# Patient Record
Sex: Male | Born: 1975
Health system: Southern US, Community
[De-identification: ages and names within clinical notes are randomized; demographics above are authoritative.]

## PROBLEM LIST (undated history)

## (undated) DIAGNOSIS — R3911 Hesitancy of micturition: Secondary | ICD-10-CM

## (undated) DIAGNOSIS — R0600 Dyspnea, unspecified: Secondary | ICD-10-CM

## (undated) DIAGNOSIS — R072 Precordial pain: Secondary | ICD-10-CM

## (undated) DIAGNOSIS — E785 Hyperlipidemia, unspecified: Secondary | ICD-10-CM

## (undated) DIAGNOSIS — R079 Chest pain, unspecified: Secondary | ICD-10-CM

## (undated) DIAGNOSIS — R42 Dizziness and giddiness: Secondary | ICD-10-CM

## (undated) DIAGNOSIS — R002 Palpitations: Secondary | ICD-10-CM

## (undated) DIAGNOSIS — R030 Elevated blood-pressure reading, without diagnosis of hypertension: Secondary | ICD-10-CM

## (undated) DIAGNOSIS — R239 Unspecified skin changes: Secondary | ICD-10-CM

## (undated) DIAGNOSIS — R600 Localized edema: Secondary | ICD-10-CM

## (undated) HISTORY — DX: Palpitations: R00.2

## (undated) HISTORY — DX: Unspecified skin changes: R23.9

## (undated) HISTORY — DX: Localized edema: R60.0

## (undated) HISTORY — DX: Hesitancy of micturition: R39.11

## (undated) HISTORY — DX: Elevated blood-pressure reading, without diagnosis of hypertension: R03.0

## (undated) HISTORY — DX: Chest pain, unspecified: R07.9

## (undated) HISTORY — DX: Dyspnea, unspecified: R06.00

## (undated) HISTORY — DX: Hyperlipidemia, unspecified: E78.5

## (undated) HISTORY — DX: Dizziness and giddiness: R42

## (undated) HISTORY — DX: Precordial pain: R07.2

## (undated) HISTORY — PX: OTHER SURGICAL HISTORY: SHX169

---

## 2003-09-13 ENCOUNTER — Emergency Department (HOSPITAL_COMMUNITY): Admission: EM | Admit: 2003-09-13 | Discharge: 2003-09-13 | Payer: Self-pay | Admitting: Emergency Medicine

## 2005-08-10 IMAGING — CT CT ABDOMEN W/O CM
1 series · 15 of 32 positions shown, 19 images · non-contrast
Comparison: none

CLINICAL DATA: 28 year old with right flank pain.
 CT ABDOMEN AND PELVIS WITHOUT CONTRAST
 Helical CT examination of the abdomen and pelvis was performed without IV contrast.  Dilute oral contrast was not used either and the examination was performed specifically to evaluate for renal/ureteral calculi/obstruction.
 The lung bases are clear.  
 CT ABDOMEN:
 The liver, spleen, pancreas, and adrenal glands demonstrate no significant findings.  The left kidney has a low attenuation lesion in the upper pole/midpole junction region anteriorly.  This is likely a benign renal cyst.  There are small bilateral renal calculi but no evidence for an acute renal obstructive process, hydronephrosis, perinephric interstitial changes, or fluid.  Both ureters are normal in caliber and there is no evidence for upper ureteral calculi.  There are no mesenteric or retroperitoneal masses or adenopathy.  The gallbladder appears normal.  The stomach, duodenum, small bowel, and colon are grossly normal but the study is limited without IV and oral contrast.  The patient does have a small hiatal hernia.
 IMPRESSION
 1.  Small bilateral renal calculi but no evidence for an acute obstructing ureteral calculus.
 2.  Probable cyst associated with the mid to upper pole region of the left kidney anteriorly.
 The appendix is visualized and appears normal.  There are no ureteral calculi and no bladder calculi.  The rectum, sigmoid colon, and visualized small bowel loops appear normal.  No pelvic masses or adenopathy.
 1.  No significant pelvic findings.

[Series 2: renal stone · axial · 0.78mm/px · z∈[-532,-152]mm · 15 of 81 slices shown, 19 images]
[im 6/81  soft-tissue]
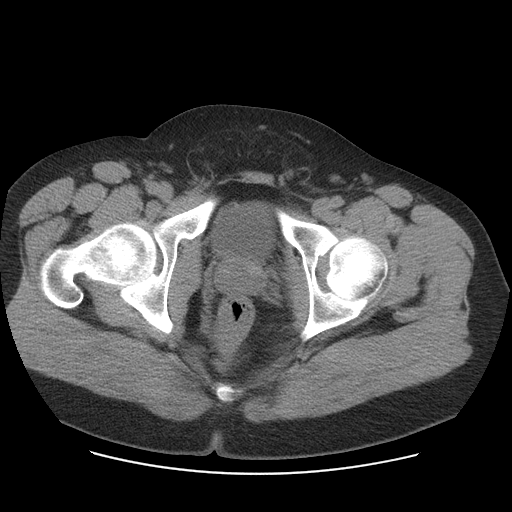
[im 6/81  bone]
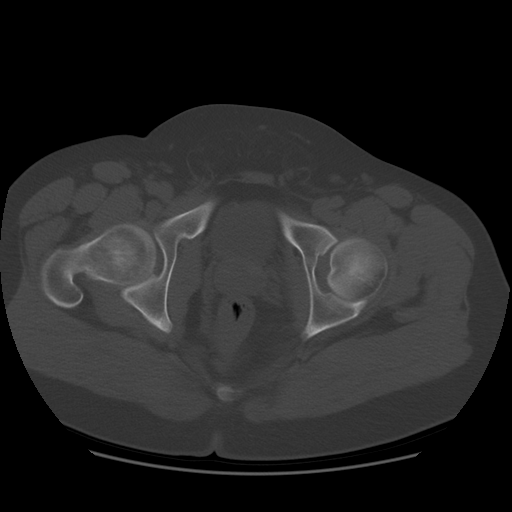
[im 11/81  soft-tissue]
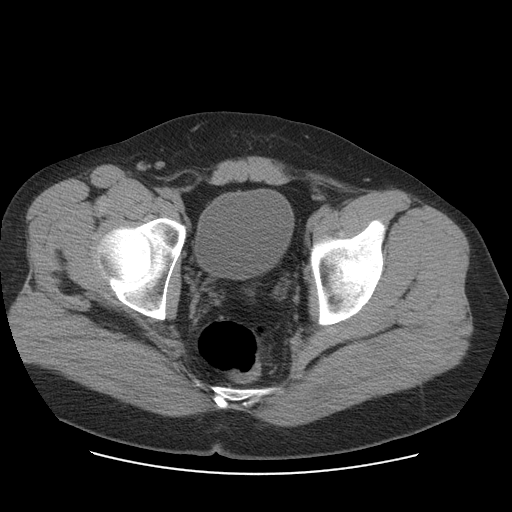
[im 16/81  soft-tissue]
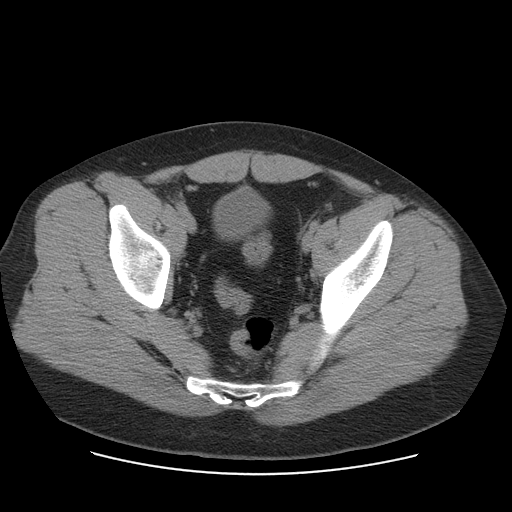
[im 24/81  soft-tissue]
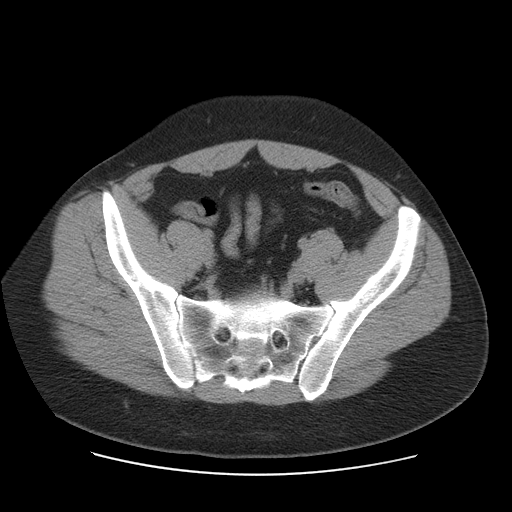
[im 29/81  soft-tissue]
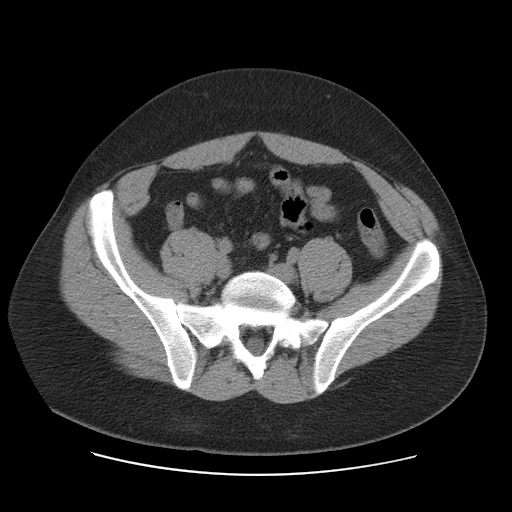
[im 34/81  soft-tissue]
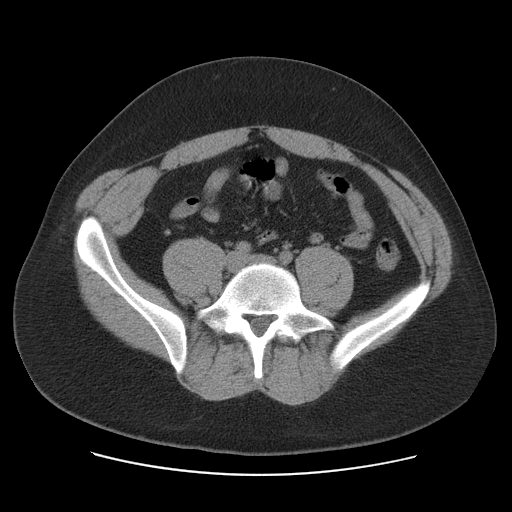
[im 42/81  soft-tissue]
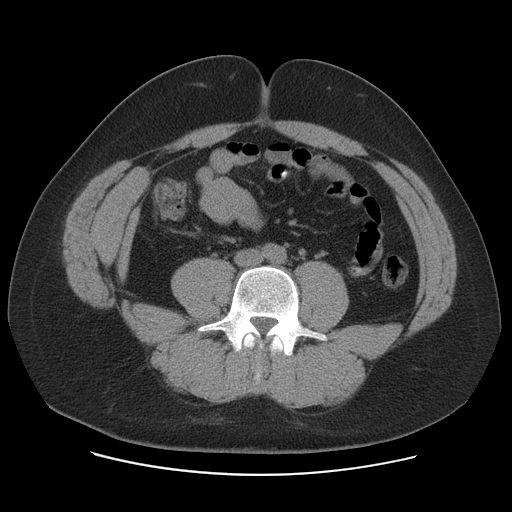
[im 47/81  soft-tissue]
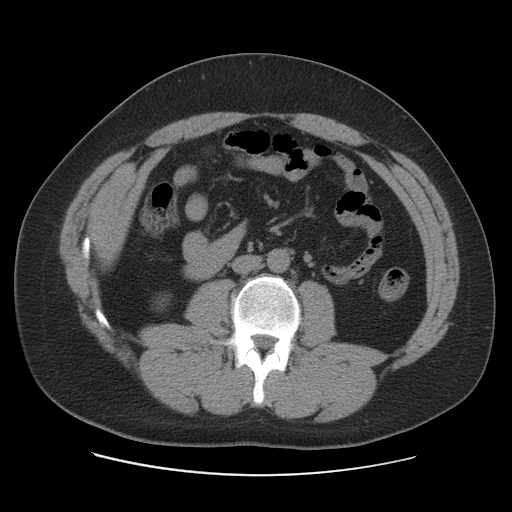
[im 52/81  soft-tissue]
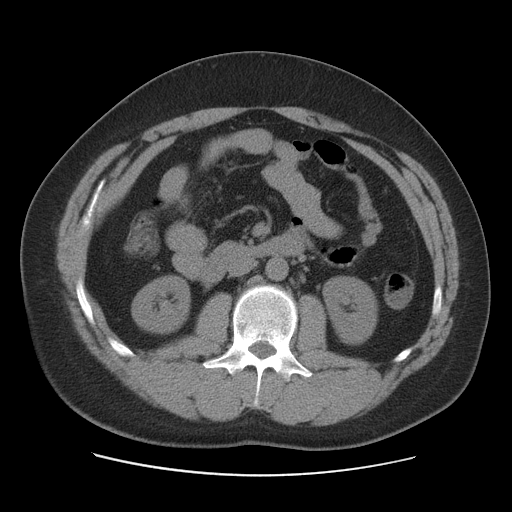
[im 52/81  bone]
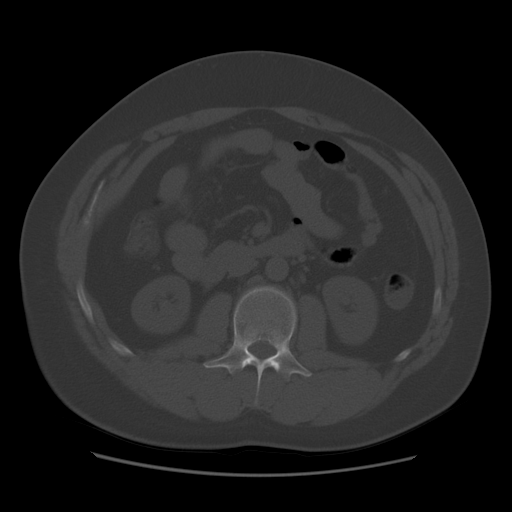
[im 57/81  soft-tissue]
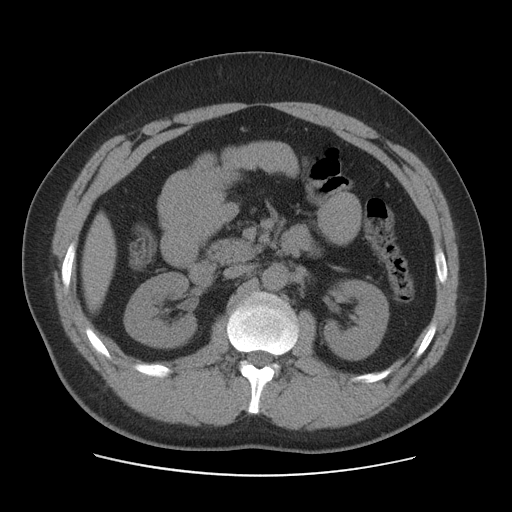
[im 65/81  soft-tissue]
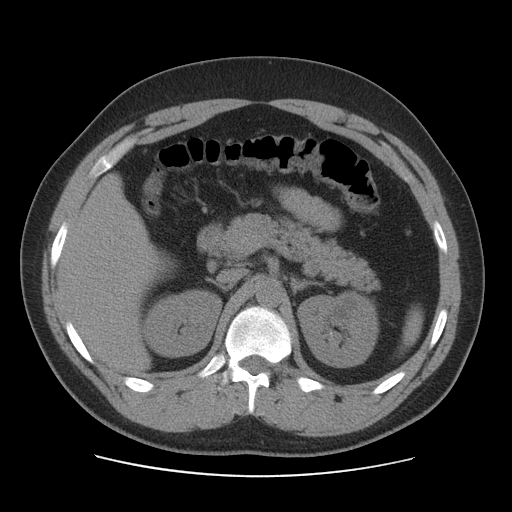
[im 70/81  soft-tissue]
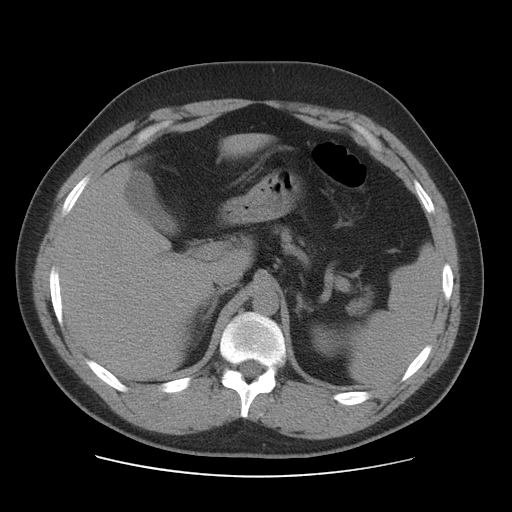
[im 70/81  lung]
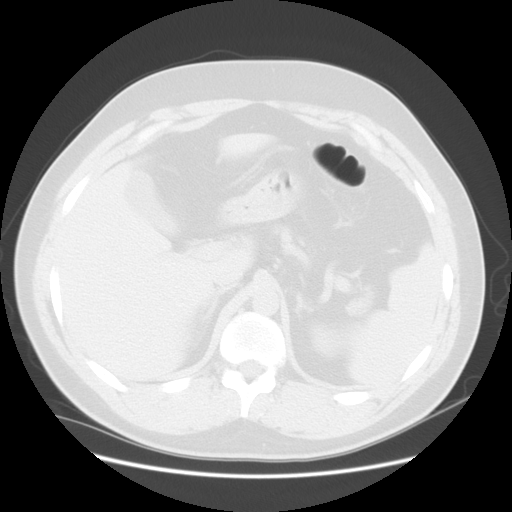
[im 73/81  lung]
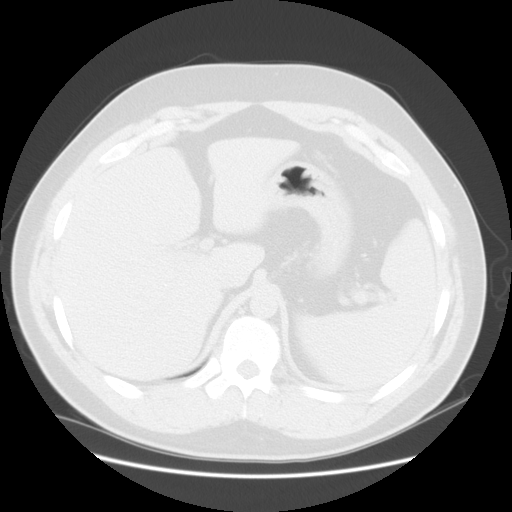
[im 75/81  soft-tissue]
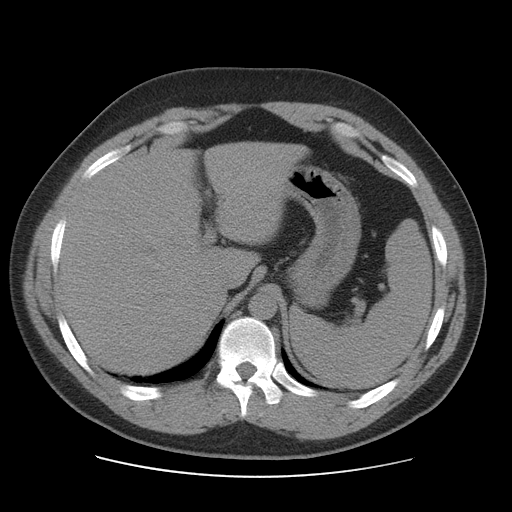
[im 75/81  lung]
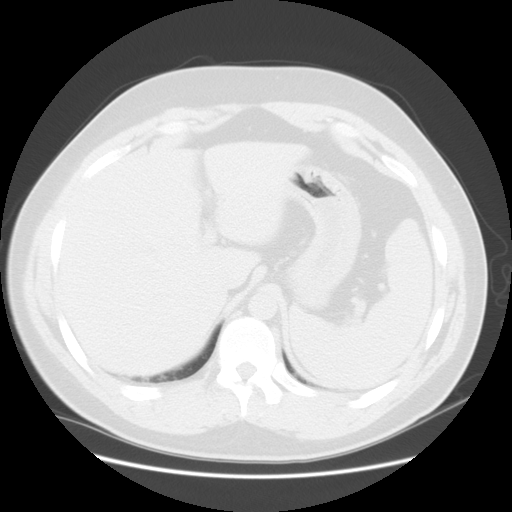
[im 78/81  lung]
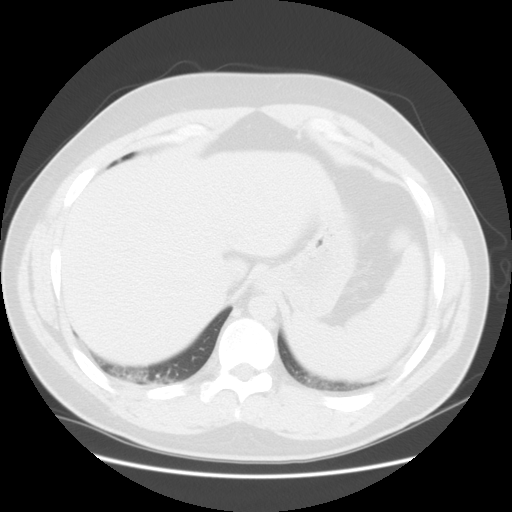

[15 of 32 positions shown; findings below may reference images not displayed]

## 2018-02-19 DIAGNOSIS — E782 Mixed hyperlipidemia: Secondary | ICD-10-CM | POA: Diagnosis not present

## 2018-02-19 DIAGNOSIS — J018 Other acute sinusitis: Secondary | ICD-10-CM | POA: Diagnosis not present

## 2018-07-16 ENCOUNTER — Telehealth: Payer: Self-pay | Admitting: Interventional Cardiology

## 2018-07-16 NOTE — Telephone Encounter (Signed)
I do not see that pt has been seen in our office before.

## 2018-07-16 NOTE — Telephone Encounter (Signed)
Left message for to call back.  Need to move appt up to 9:40A, 10:40A, 11:40A or 2pm, whichever is still available.

## 2018-07-16 NOTE — Telephone Encounter (Signed)
New Message     Pt is having High BP and swelling and Dr Benjamine Mola Is wanting the pt to be seen in office soon  Please call Beth to schedule the appt and she will reach out to the pt    Please call

## 2018-07-16 NOTE — Telephone Encounter (Signed)
Spoke with Beth at Dr. Durenda Age office and scheduled pt to see Dr. Tamala Julian 6/22.

## 2018-07-17 NOTE — Telephone Encounter (Signed)
LM for pt to call back to move 6/22 appt to a sooner time slot, same day.

## 2018-07-18 NOTE — Telephone Encounter (Signed)
Left message to call back  

## 2018-07-20 NOTE — Progress Notes (Signed)
Cardiology Office Note:    Date:  07/21/2018   ID:  Brent Williamson, DOB 28-May-1975, MRN [id number]  PCP:  Gus HeightJohnson, Andrea, PA-C  Cardiologist:  No primary care provider on file.   Referring MD: Gus HeightJohnson, Andrea, PA-C   Chief Complaint  Patient presents with  . Palpitations    Heart racing  . Shortness of Breath    History of Present Illness:    Brent Williamson is a 43 y.o. male with a hx of obstructive sleep apnea (diagnosed but untreated), morbid obesity, hyperlipidemia, who is referred by Gus HeightAndrea Johnson, PA-C for evaluation of palpitations associated with chest discomfort.  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  For the past 1-1/2 years he has been noticing episodes of sporadic increase in heart rate associated with dizziness, sweating, and heaviness in the chest.  Not precipitated by physical activity.  He feels as though his heart is racing.  He has this once or maybe twice per month.  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  He smokes cigarettes 1 to 2 packs/day for 10 years.  There is a significant family history of coronary artery disease (mother, father, and paternal grandfather).  He denies ethanol consumption.  Has a family history of sleep apnea (younger sister).  Past Medical History:  Diagnosis Date  . Chest pain   . Dizziness   . Dyspnea   . Elevated blood pressure reading without diagnosis of hypertension   . Hesitancy of micturition   . Hyperlipidemia   . Palpitations   . Pedal edema   . Precordial pain   . Skin change   . Urinary hesitancy     Past Surgical History:  Procedure Laterality Date  . KIDNEY STONES      Current Medications: Current Meds  Medication Sig  . esomeprazole (NEXIUM) 40 MG capsule Take 40 mg by mouth daily at 12 noon.  . furosemide (LASIX) 20 MG tablet Take 20 mg by mouth.  . rosuvastatin (CRESTOR) 5 MG tablet Take 5 mg by mouth daily.     Allergies:    Patient has no known allergies.   Social History   Socioeconomic History  . Marital status: Divorced    Spouse name: Not on file  . Number of children: Not on file  . Years of education: Not on file  . Highest education level: Not on file  Occupational History  . Not on file  Social Needs  . Financial resource strain: Not on file  . Food insecurity    Worry: Not on file    Inability: Not on file  . Transportation needs    Medical: Not on file    Non-medical: Not on file  Tobacco Use  . Smoking status: Current Every Day Smoker    Packs/day: 1.00    Types: Cigarettes  . Smokeless tobacco: Current User    Types: Chew  Substance and Sexual Activity  . Alcohol use: Not on file  . Drug use: Not Currently  . Sexual activity: Not on file  Lifestyle  . Physical activity    Days per week: Not on file    Minutes per session: Not on file  . Stress: Not on file  Relationships  . Social Musicianconnections    Talks on phone: Not on file    Gets together: Not on file    Attends religious service: Not on file    Active member of club or organization:  Not on file    Attends meetings of clubs or organizations: Not on file    Relationship status: Not on file  Other Topics Concern  . Not on file  Social History Narrative  . Not on file     Family History: The patient's family history includes Diabetes in his father and mother; Healthy in his sister and sister; Heart attack in his father and mother; Hypertension in his mother.  ROS:   Please see the history of present illness.    Previously diagnosed with sleep apnea but could not tolerate the CPAP titration and never went back for further evaluation.  Says he had difficulty with alcohol dependency previously.  He has been having some swelling in his lower extremities.  All other systems reviewed and are negative.  EKGs/Labs/Other Studies Reviewed:    The following studies were reviewed today: No prior cardiac evaluation  EKG:  EKG  normal sinus rhythm, poor R wave progression V1 through V4.  Essentially normal in appearance otherwise.  Recent Labs: No results found for requested labs within last 8760 hours.  Recent Lipid Panel No results found for: CHOL, TRIG, HDL, CHOLHDL, VLDL, LDLCALC, LDLDIRECT  Physical Exam:    VS:  BP 124/88   Pulse 73   Ht 6\' 7"  (2.007 m)   Wt (!) 369 lb 6.4 oz (167.6 kg)   SpO2 96%   BMI 41.61 kg/m     Wt Readings from Last 3 Encounters:  07/21/18 (!) 369 lb 6.4 oz (167.6 kg)     GEN: Morbidly obese. 6 feet 7 inches tall. No acute distress HEENT: Normal NECK: No JVD. LYMPHATICS: No lymphadenopathy CARDIAC: RRR.  No murmur, S4 gallop, trace edema VASCULAR: No pulses, no bruits RESPIRATORY:  Clear to auscultation without rales, wheezing or rhonchi  ABDOMEN: Soft, non-tender, non-distended, No pulsatile mass, MUSCULOSKELETAL: No deformity  SKIN: Warm and dry NEUROLOGIC:  Alert and oriented x 3 PSYCHIATRIC:  Normal affect   ASSESSMENT:    1. Palpitations   2. Chest discomfort   3. Essential hypertension   4. Other sleep apnea   5. Morbid obesity ([place])   6. Tobacco use   7. Educated About Covid-19 Virus Infection    PLAN:    In order of problems listed above:  1. Palpitations in this clinical situation are concerning as the patient has been previously diagnosed with sleep apnea but never completed the evaluation with CPAP titration and therefore is untreated.  Atrial fibrillation is a distinct possibility.  He has associated chest tightness which could be related to the arrhythmia but multiple risk factors for coronary disease cause concern.  We have ordered a 2D Doppler echocardiogram, and he will wear a monitor for 1 month. 2. If we identify an arrhythmia that could account for the patient's chest discomfort no further work-up will need to be done.  May need an ischemic evaluation depending upon database accumulated. 3. Blood pressure is under good control 4. Sleep study  has been ordered.  CPAP titration will be attempted. 5. Encouraged moderate aerobic activity, decrease caloric intake, and elimination of carbohydrates in diet. 6. Encourage smoking cessation.   Medication Adjustments/Labs and Tests Ordered: Current medicines are reviewed at length with the patient today.  Concerns regarding medicines are outlined above.  Orders Placed This Encounter  Procedures  . Cardiac event monitor  . EKG 12-Lead  . ECHOCARDIOGRAM COMPLETE  . Split night study   No orders of the defined types were placed in this encounter.  Patient Instructions  Medication Instructions:  Your physician recommends that you continue on your current medications as directed. Please refer to the Current Medication list given to you today.  If you need a refill on your cardiac medications before your next appointment, please call your pharmacy.   Lab work: None If you have labs (blood work) drawn today and your tests are completely normal, you will receive your results only by: Marland Kitchen. MyChart Message (if you have MyChart) OR . A paper copy in the mail If you have any lab test that is abnormal or we need to change your treatment, we will call you to review the results.  Testing/Procedures: Your physician has requested that you have an echocardiogram. Echocardiography is a painless test that uses sound waves to create images of your heart. It provides your doctor with information about the size and shape of your heart and how well your heart's chambers and valves are working. This procedure takes approximately one hour. There are no restrictions for this procedure.  Your physician has recommended that you wear an event monitor. Event monitors are medical devices that record the heart's electrical activity. Doctors most often us these monitors to diagnose arrhythmias. Arrhythmias are problems with the speed or rhythm of the heartbeat. The monitor is a small, portable device. You can wear one  while you do your normal daily activities. This is usually used to diagnose what is causing palpitations/syncope (passing out).  Your physician has recommended that you have a sleep study. This test records several body functions during sleep, including: brain activity, eye movement, oxygen and carbon dioxide blood levels, heart rate and rhythm, breathing rate and rhythm, the flow of air through your mouth and nose, snoring, body muscle movements, and chest and belly movement.   Follow-Up: At [facility], you and your health needs are our priority.  As part of our continuing mission to provide you with exceptional heart care, we have created designated Provider Care Teams.  These Care Teams include your primary Cardiologist (physician) and Advanced Practice Providers (APPs -  Physician Assistants and Nurse Practitioners) who all work together to provide you with the care you need, when you need it. You will need a follow up appointment in 6-8 weeks.  Please call our office 2 months in advance to schedule this appointment.  You may see Dr. Katrinka BlazingSmith or one of the following Advanced Practice Providers on your designated Care Team:   Norma FredricksonLori Gerhardt, NP Nada BoozerLaura Ingold, NP . Georgie ChardJill McDaniel, NP  Any Other Special Instructions Will Be Listed Below (If Applicable).       Signed, Lesleigh NoeHenry W Lenord Fralix III, MD  07/21/2018 6:06 PM    [facility]

## 2018-07-21 ENCOUNTER — Encounter (INDEPENDENT_AMBULATORY_CARE_PROVIDER_SITE_OTHER): Payer: Self-pay

## 2018-07-21 ENCOUNTER — Encounter: Payer: Self-pay | Admitting: Interventional Cardiology

## 2018-07-21 ENCOUNTER — Ambulatory Visit: Payer: Commercial Managed Care - PPO | Admitting: Interventional Cardiology

## 2018-07-21 ENCOUNTER — Other Ambulatory Visit: Payer: Self-pay

## 2018-07-21 VITALS — BP 124/88 | HR 73 | Ht 79.0 in | Wt 369.4 lb

## 2018-07-21 DIAGNOSIS — I10 Essential (primary) hypertension: Secondary | ICD-10-CM | POA: Diagnosis not present

## 2018-07-21 DIAGNOSIS — R002 Palpitations: Secondary | ICD-10-CM | POA: Diagnosis not present

## 2018-07-21 DIAGNOSIS — R0789 Other chest pain: Secondary | ICD-10-CM

## 2018-07-21 DIAGNOSIS — G4739 Other sleep apnea: Secondary | ICD-10-CM

## 2018-07-21 DIAGNOSIS — Z720 Tobacco use: Secondary | ICD-10-CM

## 2018-07-21 DIAGNOSIS — Z7189 Other specified counseling: Secondary | ICD-10-CM

## 2018-07-21 NOTE — Patient Instructions (Signed)
Medication Instructions:  Your physician recommends that you continue on your current medications as directed. Please refer to the Current Medication list given to you today.  If you need a refill on your cardiac medications before your next appointment, please call your pharmacy.   Lab work: None If you have labs (blood work) drawn today and your tests are completely normal, you will receive your results only by: Marland Kitchen MyChart Message (if you have MyChart) OR . A paper copy in the mail If you have any lab test that is abnormal or we need to change your treatment, we will call you to review the results.  Testing/Procedures: Your physician has requested that you have an echocardiogram. Echocardiography is a painless test that uses sound waves to create images of your heart. It provides your doctor with information about the size and shape of your heart and how well your heart's chambers and valves are working. This procedure takes approximately one hour. There are no restrictions for this procedure.  Your physician has recommended that you wear an event monitor. Event monitors are medical devices that record the heart's electrical activity. Doctors most often [place] these monitors to diagnose arrhythmias. Arrhythmias are problems with the speed or rhythm of the heartbeat. The monitor is a small, portable device. You can wear one while you do your normal daily activities. This is usually used to diagnose what is causing palpitations/syncope (passing out).  Your physician has recommended that you have a sleep study. This test records several body functions during sleep, including: brain activity, eye movement, oxygen and carbon dioxide blood levels, heart rate and rhythm, breathing rate and rhythm, the flow of air through your mouth and nose, snoring, body muscle movements, and chest and belly movement.   Follow-Up: At [facility], you and your health needs are our priority.  As part of our continuing  mission to provide you with exceptional heart care, we have created designated Provider Care Teams.  These Care Teams include your primary Cardiologist (physician) and Advanced Practice Providers (APPs -  Physician Assistants and Nurse Practitioners) who all work together to provide you with the care you need, when you need it. You will need a follow up appointment in 6-8 weeks.  Please call our office 2 months in advance to schedule this appointment.  You may see Dr. Tamala Julian or one of the following Advanced Practice Providers on your designated Care Team:   Truitt Merle, NP Cecilie Kicks, NP . Kathyrn Drown, NP  Any Other Special Instructions Will Be Listed Below (If Applicable).

## 2018-07-22 ENCOUNTER — Telehealth: Payer: Self-pay | Admitting: *Deleted

## 2018-07-22 ENCOUNTER — Telehealth: Payer: Self-pay | Admitting: Radiology

## 2018-07-22 NOTE — Telephone Encounter (Signed)
Enrolled patient for a 30 day [employer] Event monitor to be mailed. Patient knows to expect monitor to arrive by mail in 3-4 days

## 2018-07-22 NOTE — Telephone Encounter (Signed)
-----   Message from Loren Racer, LPN sent at 03/16/2444  5:33 PM EDT ----- Sleep study ordered

## 2018-07-31 ENCOUNTER — Other Ambulatory Visit: Payer: Self-pay

## 2018-07-31 ENCOUNTER — Ambulatory Visit (HOSPITAL_COMMUNITY): Payer: Commercial Managed Care - PPO | Attending: Cardiology

## 2018-07-31 DIAGNOSIS — I10 Essential (primary) hypertension: Secondary | ICD-10-CM | POA: Diagnosis not present

## 2018-08-15 ENCOUNTER — Telehealth: Payer: Self-pay | Admitting: *Deleted

## 2018-08-15 NOTE — Telephone Encounter (Signed)
-----   Message from Jennifer L Bowers, LPN sent at 07/21/2018  5:33 PM EDT ----- Sleep study ordered  

## 2018-08-15 NOTE — Telephone Encounter (Signed)
Staff message sent to [facility] per [employer] no PA is required for sleep study. k to schedule. Call reference # [id number].

## 2018-08-19 ENCOUNTER — Telehealth: Payer: Self-pay | Admitting: *Deleted

## 2018-08-19 NOTE — Telephone Encounter (Addendum)
Patient is scheduled for lab study on 8/3. Patient understands his sleep study will be done at [facility] sleep lab. Patient understands he will receive a sleep packet in a week or so. Patient understands to call if he does not receive the sleep packet in a timely manner. He is scheduled for COVID screening on 7/31 3:30 prior to titration.  Please place the orders.  Left detailed message at home with date and time of sleep study and informed patient to call back to confirm or reschedule. 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 I tried calling but got no answer lmtcb on his cell.

## 2018-08-19 NOTE — Telephone Encounter (Signed)
-----   Message from Loren Racer, LPN sent at 1/95/0932  8:16 AM EDT -----  ----- Message ----- From: Lauralee Evener, CMA Sent: 08/15/2018   5:10 PM EDT To: Loren Racer, LPN  Per [facility] patient does not require a PA. Ok to schedule sleep study. ----- Message ----- From: Loren Racer, LPN Sent: 6/71/2458   5:33 PM EDT To: Freada Bergeron, CMA, [facility] Sleep Studies  Sleep study ordered

## 2018-08-29 ENCOUNTER — Other Ambulatory Visit (HOSPITAL_COMMUNITY): Payer: Commercial Managed Care - PPO

## 2018-08-29 ENCOUNTER — Telehealth: Payer: Self-pay

## 2018-08-29 NOTE — Telephone Encounter (Signed)
I called pt to switch his 8/12 appt with Dr Tamala Julian from [place] to [place]. He stated that he would not be in town and wouldn't be available for this appt. He will call back to reschedule his appt.

## 2018-09-01 ENCOUNTER — Encounter: Payer: Self-pay | Admitting: *Deleted

## 2018-09-01 ENCOUNTER — Encounter (HOSPITAL_BASED_OUTPATIENT_CLINIC_OR_DEPARTMENT_OTHER): Payer: Commercial Managed Care - PPO

## 2018-09-10 ENCOUNTER — Ambulatory Visit: Payer: Commercial Managed Care - PPO | Admitting: Interventional Cardiology

## 2019-04-15 NOTE — Progress Notes (Signed)
Established Patient Office Visit  Subjective:  Patient ID: Brent Williamson, male    DOB: 1975/07/25  Age: 44 y.o. MRN: [id number]  CC:  Chief Complaint  Patient presents with  . Chest Pain    HPI Brent Williamson presents with epigastric/chest pain which he has had intermittently for months. He describes it as a soreness, but with sharp pains. He takes nexium 20 mg once daily. He has tried Catering manager which helps some. He denies nausea, vomiting, diarrhea, or constipation. He does occasionally have bright red blood in his stool. He has never had a colonoscopy.   Past Medical History:  Diagnosis Date  . Chest pain   . Dizziness   . Dyspnea   . Elevated blood pressure reading without diagnosis of hypertension   . Hesitancy of micturition   . Hyperlipidemia   . Palpitations   . Pedal edema   . Precordial pain   . Skin change   . Urinary hesitancy     Past Surgical History:  Procedure Laterality Date  . KIDNEY STONES      Family History  Problem Relation Age of Onset  . Heart attack Mother   . Diabetes Mother   . Hypertension Mother   . Heart attack Father   . Diabetes Father   . Healthy Sister   . Healthy Sister     Social History   Socioeconomic History  . Marital status: Divorced    Spouse name: Not on file  . Number of children: Not on file  . Years of education: Not on file  . Highest education level: Not on file  Occupational History  . Not on file  Tobacco Use  . Smoking status: Current Every Day Smoker    Packs/day: 1.00    Types: Cigarettes  . Smokeless tobacco: Current User    Types: Chew  Substance and Sexual Activity  . Alcohol use: Yes    Comment: socially  . Drug use: Not Currently  . Sexual activity: Not on file  Other Topics Concern  . Not on file  Social History Narrative  . Not on file   Social Determinants of Health   Financial Resource Strain:   . Difficulty of Paying Living Expenses:   Food Insecurity:   .  Worried About Programme researcher, broadcasting/film/video in the Last Year:   . Barista in the Last Year:   Transportation Needs:   . Freight forwarder (Medical):   Marland Kitchen Lack of Transportation (Non-Medical):   Physical Activity:   . Days of Exercise per Week:   . Minutes of Exercise per Session:   Stress:   . Feeling of Stress :   Social Connections:   . Frequency of Communication with Friends and Family:   . Frequency of Social Gatherings with Friends and Family:   . Attends Religious Services:   . Active Member of Clubs or Organizations:   . Attends Banker Meetings:   Marland Kitchen Marital Status:   Intimate Partner Violence:   . Fear of Current or Ex-Partner:   . Emotionally Abused:   Marland Kitchen Physically Abused:   . Sexually Abused:     Outpatient Medications Prior to Visit  Medication Sig Dispense Refill  . furosemide (LASIX) 20 MG tablet Take 20 mg by mouth.    . losartan (COZAAR) 50 MG tablet Take 50 mg by mouth daily.    . rosuvastatin (CRESTOR) 5 MG tablet Take 5 mg by mouth daily.    Marland Kitchen  esomeprazole (NEXIUM) 40 MG capsule Take 40 mg by mouth daily at 12 noon.     No facility-administered medications prior to visit.    No Known Allergies  ROS Review of Systems  Constitutional: Negative for chills, diaphoresis, fatigue and fever.  HENT: Negative for congestion, ear pain and sore throat.   Respiratory: Negative for cough and shortness of breath.   Cardiovascular: Negative for chest pain and leg swelling.  Gastrointestinal: Positive for abdominal pain. Negative for constipation, diarrhea, nausea and vomiting.       Epigastric  Musculoskeletal: Positive for back pain.       Aching lumbar regions BL.   Neurological: Negative for dizziness and headaches.      Objective:    Physical Exam  Constitutional: He appears well-developed and well-nourished.  Morbidly obese.  Cardiovascular: Normal rate, regular rhythm and normal heart sounds.  Pulmonary/Chest: Effort normal and breath  sounds normal.  Abdominal: Soft. Bowel sounds are normal. There is abdominal tenderness.  epigastric  Neurological: He is alert.  Psychiatric: He has a normal mood and affect. His behavior is normal.    BP 132/84   Pulse 72   Temp (!) 96.4 F (35.8 C)   Resp 18   Ht 6\' 5"  (1.956 m)   Wt (!) 367 lb (166.5 kg)   BMI 43.52 kg/m  Wt Readings from Last 3 Encounters:  04/16/19 (!) 367 lb (166.5 kg)  07/21/18 (!) 369 lb 6.4 oz (167.6 kg)     Health Maintenance Due  Topic Date Due  . HIV Screening  Never done  . INFLUENZA VACCINE  Never done      Assessment & Plan:  Epigastric abdominal tenderness without rebound tenderness: Gave Gi Cocktail. Minimal improvement.  Increase nexium to 40 mg one twice a day. Consider CT abdomen/pelvis if labs normal. He may needed EGD also  Other chest pain   Noncardiac. EKG normal. Blood in stool by history  Refer to Gi for colonoscopy.  Lumbar back pain - Lose weight. Recommend tylenol otc as directed.  Nicotine dependence - chantix prescribed.   Orders Placed This Encounter  Procedures  . CBC with Differential/Platelet  . Comprehensive metabolic panel  . HELICOBACTER PYLORI  ANTIBODY, IGM  . Ambulatory referral to Gastroenterology  . EKG 12-Lead    Follow-up: Return in about 4 weeks (around 05/14/2019).    Brent Brome, MD

## 2019-04-16 ENCOUNTER — Other Ambulatory Visit: Payer: Self-pay

## 2019-04-16 ENCOUNTER — Encounter: Payer: Self-pay | Admitting: Family Medicine

## 2019-04-16 ENCOUNTER — Ambulatory Visit: Payer: Commercial Managed Care - PPO | Admitting: Family Medicine

## 2019-04-16 VITALS — BP 132/84 | HR 72 | Temp 96.4°F | Resp 18 | Ht 77.0 in | Wt 367.0 lb

## 2019-04-16 DIAGNOSIS — F17219 Nicotine dependence, cigarettes, with unspecified nicotine-induced disorders: Secondary | ICD-10-CM

## 2019-04-16 DIAGNOSIS — K921 Melena: Secondary | ICD-10-CM | POA: Diagnosis not present

## 2019-04-16 DIAGNOSIS — R10816 Epigastric abdominal tenderness: Secondary | ICD-10-CM | POA: Diagnosis not present

## 2019-04-16 DIAGNOSIS — M545 Low back pain, unspecified: Secondary | ICD-10-CM | POA: Insufficient documentation

## 2019-04-16 DIAGNOSIS — R0789 Other chest pain: Secondary | ICD-10-CM | POA: Insufficient documentation

## 2019-04-16 MED ORDER — ESOMEPRAZOLE MAGNESIUM 40 MG PO CPDR: 40.0000 mg | DELAYED_RELEASE_CAPSULE | Freq: Two times a day (BID) | ORAL | 1 refills | Status: DC

## 2019-04-16 MED ORDER — VARENICLINE TARTRATE 0.5 MG PO TABS: 0.5000 mg | ORAL_TABLET | Freq: Two times a day (BID) | ORAL | 0 refills | Status: DC

## 2019-04-16 NOTE — Patient Instructions (Addendum)
1) Increase nexium to 40 mg one twice a day. 2) Labs drawn. 3) Refer to Gastroenterology for blood in stool and for abdominal pain. Consider CT scan of abdomen and pelvis if labs normal.    Gastroesophageal Reflux Disease, Adult Gastroesophageal reflux (GER) happens when acid from the stomach flows up into the tube that connects the mouth and the stomach (esophagus). Normally, food travels down the esophagus and stays in the stomach to be digested. With GER, food and stomach acid sometimes move back up into the esophagus. You may have a disease called gastroesophageal reflux disease (GERD) if the reflux:  Happens often.  Causes frequent or very bad symptoms.  Causes problems such as damage to the esophagus. When this happens, the esophagus becomes sore and swollen (inflamed). Over time, GERD can make small holes (ulcers) in the lining of the esophagus. What are the causes? This condition is caused by a problem with the muscle between the esophagus and the stomach. When this muscle is weak or not normal, it does not close properly to keep food and acid from coming back up from the stomach. The muscle can be weak because of:  Tobacco use.  Pregnancy.  Having a certain type of hernia (hiatal hernia).  Alcohol use.  Certain foods and drinks, such as coffee, chocolate, onions, and peppermint. What increases the risk? You are more likely to develop this condition if you:  Are overweight.  Have a disease that affects your connective tissue.  Use NSAID medicines. What are the signs or symptoms? Symptoms of this condition include:  Heartburn.  Difficult or painful swallowing.  The feeling of having a lump in the throat.  A bitter taste in the mouth.  Bad breath.  Having a lot of saliva.  Having an upset or bloated stomach.  Belching.  Chest pain. Different conditions can cause chest pain. Make sure you see your doctor if you have chest pain.  Shortness of breath or noisy  breathing (wheezing).  Ongoing (chronic) cough or a cough at night.  Wearing away of the surface of teeth (tooth enamel).  Weight loss. How is this treated? Treatment will depend on how bad your symptoms are. Your doctor may suggest:  Changes to your diet.  Medicine.  Surgery. Follow these instructions at home: Eating and drinking   Follow a diet as told by your doctor. You may need to avoid foods and drinks such as: ? Coffee and tea (with or without caffeine). ? Drinks that contain alcohol. ? Energy drinks and sports drinks. ? Bubbly (carbonated) drinks or sodas. ? Chocolate and cocoa. ? Peppermint and mint flavorings. ? Garlic and onions. ? Horseradish. ? Spicy and acidic foods. These include peppers, chili powder, curry powder, vinegar, hot sauces, and BBQ sauce. ? Citrus fruit juices and citrus fruits, such as oranges, lemons, and limes. ? Tomato-based foods. These include red sauce, chili, salsa, and pizza with red sauce. ? Fried and fatty foods. These include donuts, french fries, potato chips, and high-fat dressings. ? High-fat meats. These include hot dogs, rib eye steak, sausage, ham, and bacon. ? High-fat dairy items, such as whole milk, butter, and cream cheese.  Eat small meals often. Avoid eating large meals.  Avoid drinking large amounts of liquid with your meals.  Avoid eating meals during the 2-3 hours before bedtime.  Avoid lying down right after you eat.  Do not exercise right after you eat. Lifestyle   Do not use any products that contain nicotine or tobacco.  These include cigarettes, e-cigarettes, and chewing tobacco. If you need help quitting, ask your doctor.  Try to lower your stress. If you need help doing this, ask your doctor.  If you are overweight, lose an amount of weight that is healthy for you. Ask your doctor about a safe weight loss goal. General instructions  Pay attention to any changes in your symptoms.  Take over-the-counter  and prescription medicines only as told by your doctor. Do not take aspirin, ibuprofen, or other NSAIDs unless your doctor says it is okay.  Wear loose clothes. Do not wear anything tight around your waist.  Raise (elevate) the head of your bed about 6 inches (15 cm).  Avoid bending over if this makes your symptoms worse.  Keep all follow-up visits as told by your doctor. This is important. Contact a doctor if:  You have new symptoms.  You lose weight and you do not know why.  You have trouble swallowing or it hurts to swallow.  You have wheezing or a cough that keeps happening.  Your symptoms do not get better with treatment.  You have a hoarse voice. Get help right away if:  You have pain in your arms, neck, jaw, teeth, or back.  You feel sweaty, dizzy, or light-headed.  You have chest pain or shortness of breath.  You throw up (vomit) and your throw-up looks like blood or coffee grounds.  You pass out (faint).  Your poop (stool) is bloody or black.  You cannot swallow, drink, or eat. Summary  If a person has gastroesophageal reflux disease (GERD), food and stomach acid move back up into the esophagus and cause symptoms or problems such as damage to the esophagus.  Treatment will depend on how bad your symptoms are.  Follow a diet as told by your doctor.  Take all medicines only as told by your doctor. This information is not intended to replace advice given to you by your health care provider. Make sure you discuss any questions you have with your health care provider. Document Revised: 07/24/2017 Document Reviewed: 07/24/2017 Elsevier Patient Education  2020 [employer].

## 2019-04-17 ENCOUNTER — Ambulatory Visit: Payer: Commercial Managed Care - PPO | Admitting: Family Medicine

## 2019-04-17 LAB — COMPREHENSIVE METABOLIC PANEL
ALT: 26 IU/L (ref 0–44)
AST: 23 IU/L (ref 0–40)
Albumin/Globulin Ratio: 1.7 (ref 1.2–2.2)
Albumin: 4.1 g/dL (ref 4.0–5.0)
Alkaline Phosphatase: 112 IU/L (ref 39–117)
BUN/Creatinine Ratio: 14 (ref 9–20)
BUN: 13 mg/dL (ref 6–24)
Bilirubin Total: 0.3 mg/dL (ref 0.0–1.2)
CO2: 24 mmol/L (ref 20–29)
Calcium: 9.3 mg/dL (ref 8.7–10.2)
Chloride: 105 mmol/L (ref 96–106)
Creatinine, Ser: 0.91 mg/dL (ref 0.76–1.27)
GFR calc Af Amer: 118 mL/min/{1.73_m2} (ref >59)
GFR calc non Af Amer: 102 mL/min/{1.73_m2} (ref >59)
Globulin, Total: 2.4 g/dL (ref 1.5–4.5)
Glucose: 88 mg/dL (ref 65–99)
Potassium: 4.9 mmol/L (ref 3.5–5.2)
Sodium: 143 mmol/L (ref 134–144)
Total Protein: 6.5 g/dL (ref 6.0–8.5)

## 2019-04-17 LAB — CBC WITH DIFFERENTIAL/PLATELET
Basophils Absolute: 0.1 10*3/uL (ref 0.0–0.2)
Basos: 1 %
EOS (ABSOLUTE): 0.1 10*3/uL (ref 0.0–0.4)
Eos: 1 %
Hematocrit: 43.1 % (ref 37.5–51.0)
Hemoglobin: 14.5 g/dL (ref 13.0–17.7)
Immature Grans (Abs): 0 10*3/uL (ref 0.0–0.1)
Immature Granulocytes: 0 %
Lymphocytes Absolute: 1.9 10*3/uL (ref 0.7–3.1)
Lymphs: 20 %
MCH: 29.3 pg (ref 26.6–33.0)
MCHC: 33.6 g/dL (ref 31.5–35.7)
MCV: 87 fL (ref 79–97)
Monocytes Absolute: 0.6 10*3/uL (ref 0.1–0.9)
Monocytes: 6 %
Neutrophils Absolute: 6.9 10*3/uL (ref 1.4–7.0)
Neutrophils: 72 %
Platelets: 306 10*3/uL (ref 150–450)
RBC: 4.95 x10E6/uL (ref 4.14–5.80)
RDW: 12.5 % (ref 11.6–15.4)
WBC: 9.6 10*3/uL (ref 3.4–10.8)

## 2019-04-17 LAB — HELICOBACTER PYLORI  ANTIBODY, IGM: H pylori, IgM Abs: <9 units (ref 0.0–8.9)

## 2019-05-12 NOTE — Progress Notes (Signed)
Established Patient Office Visit  Subjective:  Patient ID: Brent Williamson, male    DOB: May 27, 1975  Age: 44 y.o. MRN: [id number]  CC:  Chief Complaint  Patient presents with  . Abdominal Pain    4 week f/u. Has appt on Monday for stomach. Patient states that his stomach "burns"    HPI Brent Williamson presents for follow up of abdominal pain. Labs were good. Increased nexium 40 mg one twice a day. Has not helped. Some nausea after eating. No vomiting. Not always associated with eating. He is scheduled to see Dr. Jennye Boroughs next week.  Denies constipation or diarrhea. He does report some low back pain with a history of kidney stones.   Patient is interested in quitting smoking and we had a lengthy discussion at his last visit and I had sent chantix to [facility] in [place]. Per the patient they had to order it and still have nto gotten it for him.   Past Medical History:  Diagnosis Date  . Chest pain   . Dizziness   . Dyspnea   . Elevated blood pressure reading without diagnosis of hypertension   . Hesitancy of micturition   . Hyperlipidemia   . Palpitations   . Pedal edema   . Precordial pain   . Skin change   . Urinary hesitancy     Past Surgical History:  Procedure Laterality Date  . KIDNEY STONES      Family History  Problem Relation Age of Onset  . Heart attack Mother   . Diabetes Mother   . Hypertension Mother   . Heart attack Father   . Diabetes Father   . Healthy Sister   . Healthy Sister     Social History   Socioeconomic History  . Marital status: Divorced    Spouse name: Not on file  . Number of children: Not on file  . Years of education: Not on file  . Highest education level: Not on file  Occupational History  . Not on file  Tobacco Use  . Smoking status: Current Every Day Smoker    Packs/day: 1.00    Types: Cigarettes  . Smokeless tobacco: Current User    Types: Chew  Substance and Sexual Activity  . Alcohol use: Yes   Comment: socially  . Drug use: Not Currently  . Sexual activity: Not on file  Other Topics Concern  . Not on file  Social History Narrative  . Not on file   Social Determinants of Health   Financial Resource Strain:   . Difficulty of Paying Living Expenses:   Food Insecurity:   . Worried About Programme researcher, broadcasting/film/video in the Last Year:   . Barista in the Last Year:   Transportation Needs:   . Freight forwarder (Medical):   Marland Kitchen Lack of Transportation (Non-Medical):   Physical Activity:   . Days of Exercise per Week:   . Minutes of Exercise per Session:   Stress:   . Feeling of Stress :   Social Connections:   . Frequency of Communication with Friends and Family:   . Frequency of Social Gatherings with Friends and Family:   . Attends Religious Services:   . Active Member of Clubs or Organizations:   . Attends Banker Meetings:   Marland Kitchen Marital Status:   Intimate Partner Violence:   . Fear of Current or Ex-Partner:   . Emotionally Abused:   Marland Kitchen Physically Abused:   .  Sexually Abused:     Outpatient Medications Prior to Visit  Medication Sig Dispense Refill  . esomeprazole (NEXIUM) 40 MG capsule Take 1 capsule (40 mg total) by mouth 2 (two) times daily before a meal. 60 capsule 1  . furosemide (LASIX) 20 MG tablet Take 20 mg by mouth.    . losartan (COZAAR) 50 MG tablet Take 50 mg by mouth daily.    . rosuvastatin (CRESTOR) 5 MG tablet Take 5 mg by mouth daily.    . varenicline (CHANTIX) 0.5 MG tablet Take 1 tablet (0.5 mg total) by mouth 2 (two) times daily. 60 tablet 0   No facility-administered medications prior to visit.    Allergies  Allergen Reactions  . Guaifenesin Hives  . Strawberry Extract Anaphylaxis    ROS Review of Systems  Constitutional: Negative for chills, diaphoresis, fatigue and fever.  HENT: Negative for congestion, ear pain and sore throat.   Respiratory: Negative for cough and shortness of breath.   Cardiovascular: Negative for  chest pain and leg swelling.  Gastrointestinal: Positive for abdominal pain and nausea (Sometimes). Negative for constipation, diarrhea and vomiting.  Genitourinary: Negative for dysuria and urgency.  Musculoskeletal: Positive for back pain. Negative for arthralgias and myalgias.  Neurological: Positive for headaches (Taking claritin for Allergies). Negative for dizziness.  Psychiatric/Behavioral: Negative for dysphoric mood. The patient is not nervous/anxious.       Objective:    Physical Exam  Constitutional: He appears well-developed and well-nourished.  Cardiovascular: Normal rate, regular rhythm and normal heart sounds.  Pulmonary/Chest: Effort normal and breath sounds normal.  Abdominal: Soft. Bowel sounds are normal. He exhibits no distension and no mass. There is abdominal tenderness. There is no rebound and no guarding.  Generalized diffuse tenderness. Negative murphy's.  Neurological: He is alert.  Psychiatric: He has a normal mood and affect. His behavior is normal.    BP (!) 138/98 (BP Location: Right Arm, Patient Position: Sitting)   Pulse 64   Temp (!) 97.4 F (36.3 C) (Temporal)   Ht 6\' 6"  (1.981 m)   Wt (!) 360 lb (163.3 kg)   SpO2 99%   BMI 41.60 kg/m  Wt Readings from Last 3 Encounters:  05/15/19 (!) 360 lb (163.3 kg)  04/16/19 (!) 367 lb (166.5 kg)  07/21/18 (!) 369 lb 6.4 oz (167.6 kg)     Health Maintenance Due  Topic Date Due  . HIV Screening  Never done    There are no preventive care reminders to display for this patient.  No results found for: TSH Lab Results  Component Value Date   WBC 9.6 04/16/2019   HGB 14.5 04/16/2019   HCT 43.1 04/16/2019   MCV 87 04/16/2019   PLT 306 04/16/2019   Lab Results  Component Value Date   NA 143 04/16/2019   K 4.9 04/16/2019   CO2 24 04/16/2019   GLUCOSE 88 04/16/2019   BUN 13 04/16/2019   CREATININE 0.91 04/16/2019   BILITOT 0.3 04/16/2019   ALKPHOS 112 04/16/2019   AST 23 04/16/2019   ALT 26  04/16/2019   PROT 6.5 04/16/2019   ALBUMIN 4.1 04/16/2019   CALCIUM 9.3 04/16/2019   No results found for: CHOL No results found for: HDL No results found for: LDLCALC No results found for: TRIG No results found for: CHOLHDL No results found for: HGBA1C    Assessment & Plan:  1. Generalized abdominal tenderness without rebound tenderness Etiology is unclear. DDX:  diverticulitis, pud, gastritis, cancer.  Biliary  dyskinesia is possibilty, but history is off and his tenderness is not focused in the ruq.  Will order ct scan of abdomen/pelvis. Keep appt with GI. May need endoscopies.   - CT Abdomen Pelvis W Contrast; Future - POCT URINALYSIS DIP (CLINITEK) - normal.  2. Cigarette nicotine dependence with nicotine-induced disorder Sent chantix to alternative pharmacy. Encouragement given.   Follow-up: Return for to determine after ct scan.Blane Ohara, MD

## 2019-05-15 ENCOUNTER — Other Ambulatory Visit: Payer: Self-pay

## 2019-05-15 ENCOUNTER — Ambulatory Visit: Payer: Commercial Managed Care - PPO | Admitting: Family Medicine

## 2019-05-15 ENCOUNTER — Encounter: Payer: Self-pay | Admitting: Family Medicine

## 2019-05-15 VITALS — BP 138/98 | HR 64 | Temp 97.4°F | Ht 78.0 in | Wt 360.0 lb

## 2019-05-15 DIAGNOSIS — F17219 Nicotine dependence, cigarettes, with unspecified nicotine-induced disorders: Secondary | ICD-10-CM

## 2019-05-15 DIAGNOSIS — R10827 Generalized rebound abdominal tenderness: Secondary | ICD-10-CM | POA: Insufficient documentation

## 2019-05-15 DIAGNOSIS — R10817 Generalized abdominal tenderness: Secondary | ICD-10-CM | POA: Diagnosis not present

## 2019-05-15 MED ORDER — VARENICLINE TARTRATE 0.5 MG PO TABS: 0.5000 mg | ORAL_TABLET | Freq: Two times a day (BID) | ORAL | 0 refills | Status: DC

## 2019-05-17 LAB — POCT URINALYSIS DIP (CLINITEK)
Bilirubin, UA: NEGATIVE
Blood, UA: NEGATIVE
Glucose, UA: NEGATIVE mg/dL
Ketones, POC UA: NEGATIVE mg/dL
Leukocytes, UA: NEGATIVE
Nitrite, UA: NEGATIVE

## 2019-05-29 ENCOUNTER — Ambulatory Visit: Payer: Commercial Managed Care - PPO

## 2019-05-29 ENCOUNTER — Other Ambulatory Visit: Payer: Self-pay

## 2019-05-29 DIAGNOSIS — Z01812 Encounter for preprocedural laboratory examination: Secondary | ICD-10-CM

## 2019-05-30 LAB — NOVEL CORONAVIRUS, NAA: SARS-CoV-2, NAA: NOT DETECTED

## 2019-05-30 LAB — SARS-COV-2, NAA 2 DAY TAT

## 2019-06-04 LAB — HM COLONOSCOPY

## 2019-06-05 ENCOUNTER — Other Ambulatory Visit: Payer: Self-pay

## 2019-06-05 DIAGNOSIS — N200 Calculus of kidney: Secondary | ICD-10-CM

## 2019-06-05 DIAGNOSIS — R10817 Generalized abdominal tenderness: Secondary | ICD-10-CM

## 2019-06-11 ENCOUNTER — Other Ambulatory Visit: Payer: Self-pay | Admitting: Family Medicine

## 2019-06-11 DIAGNOSIS — R10816 Epigastric abdominal tenderness: Secondary | ICD-10-CM

## 2019-06-16 ENCOUNTER — Other Ambulatory Visit: Payer: Self-pay

## 2019-06-16 MED ORDER — MELOXICAM 7.5 MG PO TABS: 7.5000 mg | ORAL_TABLET | Freq: Two times a day (BID) | ORAL | 0 refills | Status: DC

## 2019-07-03 ENCOUNTER — Other Ambulatory Visit: Payer: Self-pay | Admitting: Family Medicine

## 2019-07-03 ENCOUNTER — Other Ambulatory Visit: Payer: Self-pay

## 2019-07-03 ENCOUNTER — Ambulatory Visit: Payer: Commercial Managed Care - PPO

## 2019-07-03 DIAGNOSIS — Z01818 Encounter for other preprocedural examination: Secondary | ICD-10-CM

## 2019-07-04 LAB — NOVEL CORONAVIRUS, NAA: SARS-CoV-2, NAA: NOT DETECTED

## 2019-07-04 LAB — SARS-COV-2, NAA 2 DAY TAT

## 2019-07-12 ENCOUNTER — Other Ambulatory Visit: Payer: Self-pay | Admitting: Family Medicine

## 2019-08-13 ENCOUNTER — Other Ambulatory Visit: Payer: Self-pay | Admitting: Family Medicine

## 2019-08-13 DIAGNOSIS — R10816 Epigastric abdominal tenderness: Secondary | ICD-10-CM

## 2020-01-28 ENCOUNTER — Ambulatory Visit (INDEPENDENT_AMBULATORY_CARE_PROVIDER_SITE_OTHER): Payer: Commercial Managed Care - PPO

## 2020-01-28 ENCOUNTER — Other Ambulatory Visit: Payer: Self-pay

## 2020-01-28 DIAGNOSIS — Z23 Encounter for immunization: Secondary | ICD-10-CM | POA: Diagnosis not present

## 2020-01-28 NOTE — Progress Notes (Signed)
   Covid-19 Vaccination Clinic  Name:  RAPHAEL FITZPATRICK    MRN: [id number] DOB: [birth year]  01/28/2020  Mr. Deese was observed post Covid-19 immunization for 15 minutes without incident. He was provided with Vaccine Information Sheet and instruction to access the V-Safe system.   Mr. Inabinet was instructed to call 911 with any severe reactions post vaccine: Marland Kitchen Difficulty breathing  . Swelling of face and throat  . A fast heartbeat  . A bad rash all over body  . Dizziness and weakness   Immunizations Administered    Name Date Dose VIS Date Route   Pfizer COVID-19 Vaccine 01/28/2020 10:45 AM 0.3 mL 11/18/2019 Intramuscular   Manufacturer: [employer], [employer]   Lot: [id number]   NDC: [address]-1

## 2020-02-19 ENCOUNTER — Encounter: Payer: Self-pay | Admitting: Family Medicine

## 2020-02-19 ENCOUNTER — Telehealth (INDEPENDENT_AMBULATORY_CARE_PROVIDER_SITE_OTHER): Payer: 59 | Admitting: Family Medicine

## 2020-02-19 VITALS — Ht 79.0 in | Wt 350.0 lb

## 2020-02-19 DIAGNOSIS — Z20828 Contact with and (suspected) exposure to other viral communicable diseases: Secondary | ICD-10-CM

## 2020-02-19 DIAGNOSIS — J069 Acute upper respiratory infection, unspecified: Secondary | ICD-10-CM | POA: Diagnosis not present

## 2020-02-19 DIAGNOSIS — J020 Streptococcal pharyngitis: Secondary | ICD-10-CM | POA: Diagnosis not present

## 2020-02-19 LAB — POCT RAPID STREP A (OFFICE): Rapid Strep A Screen: POSITIVE — AB

## 2020-02-19 LAB — POC COVID19 BINAXNOW: SARS Coronavirus 2 Ag: NEGATIVE

## 2020-02-19 MED ORDER — AMOXICILLIN-POT CLAVULANATE 875-125 MG PO TABS: 1.0000 | ORAL_TABLET | Freq: Two times a day (BID) | ORAL | 0 refills | Status: DC

## 2020-02-19 NOTE — Progress Notes (Signed)
Virtual Visit via Video Note   This visit type was conducted due to national recommendations for restrictions regarding the COVID-19 Pandemic (e.g. social distancing) in an effort to limit this patient's exposure and mitigate transmission in our community.  Due to his co-morbid illnesses, this patient is at least at moderate risk for complications without adequate follow up.  This format is felt to be most appropriate for this patient at this time.  All issues noted in this document were discussed and addressed.  A limited physical exam was performed with this format.  A verbal consent was obtained for the virtual visit.   Date:  02/19/2020   ID:  Brent Williamson, DOB 02-Feb-1975, MRN [id number]  Patient Location: Home Provider Location: Office/Clinic  PCP:  Blane Ohara, MD   Evaluation Performed: acute Chief Complaint:  Sore throat  History of Present Illness:    Brent Williamson is a 45 y.o. male with  Complains of body aches, sore throat, chest tightness, headache, Denies cough  Did have covid shots, did not have flu shot. Exposure. YES  The patient does have symptoms concerning for COVID-19 infection (fever, chills, cough, or new shortness of breath).    Past Medical History:  Diagnosis Date  . Chest pain   . Dizziness   . Dyspnea   . Elevated blood pressure reading without diagnosis of hypertension   . Hesitancy of micturition   . Hyperlipidemia   . Palpitations   . Pedal edema   . Precordial pain   . Skin change   . Urinary hesitancy     Past Surgical History:  Procedure Laterality Date  . KIDNEY STONES      Family History  Problem Relation Age of Onset  . Heart attack Mother   . Diabetes Mother   . Hypertension Mother   . Heart attack Father   . Diabetes Father   . Healthy Sister   . Healthy Sister     Social History   Socioeconomic History  . Marital status: Divorced    Spouse name: Not on file  . Number of children: Not on file  .  Years of education: Not on file  . Highest education level: Not on file  Occupational History  . Not on file  Tobacco Use  . Smoking status: Current Every Day Smoker    Packs/day: 1.00    Types: Cigarettes  . Smokeless tobacco: Current User    Types: Chew  Substance and Sexual Activity  . Alcohol use: Yes    Comment: socially  . Drug use: Not Currently  . Sexual activity: Not on file  Other Topics Concern  . Not on file  Social History Narrative  . Not on file   Social Determinants of Health   Financial Resource Strain: Not on file  Food Insecurity: Not on file  Transportation Needs: Not on file  Physical Activity: Not on file  Stress: Not on file  Social Connections: Not on file  Intimate Partner Violence: Not on file    Outpatient Medications Prior to Visit  Medication Sig Dispense Refill  . esomeprazole (NEXIUM) 40 MG capsule TAKE 1 CAPSULE(40 MG) BY MOUTH TWICE DAILY BEFORE A MEAL 60 capsule 1  . furosemide (LASIX) 20 MG tablet Take 20 mg by mouth.    . losartan (COZAAR) 50 MG tablet Take 50 mg by mouth daily.    . meloxicam (MOBIC) 7.5 MG tablet Take 1 tablet (7.5 mg total) by mouth 2 (two) times daily.  60 tablet 0  . rosuvastatin (CRESTOR) 10 MG tablet TAKE 1 TABLET BY MOUTH EVERY DAY 90 tablet 0  . rosuvastatin (CRESTOR) 5 MG tablet Take 5 mg by mouth daily.    . varenicline (CHANTIX) 0.5 MG tablet Take 1 tablet (0.5 mg total) by mouth 2 (two) times daily. 60 tablet 0   No facility-administered medications prior to visit.    Allergies:   Guaifenesin and Strawberry extract   Social History   Tobacco Use  . Smoking status: Current Every Day Smoker    Packs/day: 1.00    Types: Cigarettes  . Smokeless tobacco: Current User    Types: Chew  Substance Use Topics  . Alcohol use: Yes    Comment: socially  . Drug use: Not Currently     ROS   Labs/Other Tests and Data Reviewed:    Recent Labs: 04/16/2019: ALT 26; BUN 13; Creatinine, Ser 0.91; Hemoglobin  14.5; Platelets 306; Potassium 4.9; Sodium 143   Recent Lipid Panel No results found for: CHOL, TRIG, HDL, CHOLHDL, LDLCALC, LDLDIRECT  Wt Readings from Last 3 Encounters:  02/19/20 (!) 350 lb (158.8 kg)  05/15/19 (!) 360 lb (163.3 kg)  04/16/19 (!) 367 lb (166.5 kg)     Objective:    Vital Signs:  Ht 6\' 7"  (2.007 m)   Wt (!) 350 lb (158.8 kg)   BMI 39.43 kg/m     Physical Exam   ASSESSMENT & PLAN:   1. Strep pharyngitis - POCT rapid strep A negative. - Augmentin rx sent.   2. Upper respiratory tract infection, unspecified type - POC COVID-19 BinaxNow negative - Novel Coronavirus, NAA ([facility]) send out.    Rest, fluids, tylenol.  Orders Placed This Encounter  Procedures  . Novel Coronavirus, NAA ([facility])  . POCT rapid strep A  . POC COVID-19 BinaxNow     No orders of the defined types were placed in this encounter.   COVID-19 Education: The signs and symptoms of COVID-19 were discussed with the patient and how to seek care for testing (follow up with PCP or arrange E-visit). The importance of social distancing was discussed today.   I spent 10 minutes dedicated to the care of this patient.  Follow Up:  Virtual Visit  prn  Signed, , MD  02/19/2020 12:27 PM    [facility]

## 2020-02-19 NOTE — Patient Instructions (Signed)
Quarantine until covid 19 send out test is back please.   Strep Throat, Adult Strep throat is an infection of the throat. It is caused by germs (bacteria). Strep throat is common during the cold months of the year. It mostly affects children who are 46-46 years old. However, people of all ages can get it at any time of the year. When strep throat affects the tonsils, it is called tonsillitis. When it affects the back of the throat, it is called pharyngitis. This infection spreads from person to person through coughing, sneezing, or having close contact. What are the causes? This condition is caused by the Streptococcus pyogenes germ. What increases the risk? You are more likely to develop this condition if:  You care for young children. Children are more likely to get strep throat and may spread it to others.  You go to crowded places. Germs can spread easily in such places.  You kiss or touch someone who has strep throat. What are the signs or symptoms? Symptoms of this condition include:  Fever or chills.  Redness, swelling, or pain in the tonsils or throat.  Pain or trouble when swallowing.  White or yellow spots on the tonsils or throat.  Tender glands in the neck and under the jaw.  Bad breath.  Red rash all over the body. This is rare. How is this treated? This condition may be treated with:  Medicines that kill germs (antibiotics).  Medicines that treat pain or fever. These include: ? Ibuprofen or acetaminophen. ? Aspirin, only for patients who are over the age of 46. ? Throat lozenges. ? Throat sprays. Follow these instructions at home: Medicines  Take over-the-counter and prescription medicines only as told by your doctor.  Take your antibiotic medicine as told by your doctor. Do not stop taking the antibiotic even if you start to feel better.   Eating and drinking  If you have trouble swallowing, eat soft foods until your throat feels better.  Drink enough  fluid to keep your pee (urine) pale yellow.  To help with pain, you may have: ? Warm fluids, such as soup and tea. ? Cold fluids, such as frozen desserts or popsicles.   General instructions  Rinse your mouth (gargle) with a salt-water mixture 3-4 times a day or as needed. To make a salt-water mixture, dissolve -1 tsp (3-6 g) of salt in 1 cup (237 mL) of warm water.  Rest as much as you can.  Stay home from work or school until you have been taking antibiotics for 24 hours.  Avoid smoking or being around people who smoke.  Keep all follow-up visits as told by your doctor. This is important. How is this prevented?  Do not share food, drinking cups, or personal items. They can cause the germs to spread.  Wash your hands well with soap and water. Make sure that all people in your house wash their hands well.  Have family members tested if they have a fever or a sore throat. They may need an antibiotic if they have strep throat.   Contact a doctor if:  You have swelling in your neck that keeps getting bigger.  You get a rash, cough, or earache.  You cough up a thick fluid that is green, yellow-brown, or bloody.  You have pain that does not get better with medicine.  Your symptoms get worse instead of getting better.  You have a fever. Get help right away if:  You vomit.  You  have a very bad headache.  Your neck hurts or feels stiff.  You have chest pain or are short of breath.  You have drooling, very bad throat pain, or changes in your voice.  Your neck is swollen, or the skin gets red and tender.  Your mouth is dry, or you are peeing less than normal.  You keep feeling more tired or have trouble waking up.  Your joints are red or painful. Summary  Strep throat is an infection of the throat. It is caused by germs (bacteria).  This infection can spread from person to person through coughing, sneezing, or having close contact.  Take your medicines, including  antibiotics, as told by your doctor. Do not stop taking the antibiotic even if you start to feel better.  To prevent the spread of germs, wash your hands well with soap and water. Have others do the same. Do not share food, drinking cups, or personal items.  Get help right away if you have a bad headache, chest pain, shortness of breath, a stiff or painful neck, or you vomit. This information is not intended to replace advice given to you by your health care provider. Make sure you discuss any questions you have with your health care provider. Document Revised: 04/04/2018 Document Reviewed: 04/04/2018 Elsevier Patient Education  2021 [employer].

## 2020-02-20 LAB — SARS-COV-2, NAA 2 DAY TAT

## 2020-02-20 LAB — NOVEL CORONAVIRUS, NAA: SARS-CoV-2, NAA: NOT DETECTED

## 2020-03-08 ENCOUNTER — Other Ambulatory Visit: Payer: Self-pay | Admitting: Family Medicine

## 2020-03-08 DIAGNOSIS — R10816 Epigastric abdominal tenderness: Secondary | ICD-10-CM

## 2020-03-08 MED ORDER — ESOMEPRAZOLE MAGNESIUM 40 MG PO CPDR: DELAYED_RELEASE_CAPSULE | ORAL | 0 refills | Status: DC

## 2020-03-13 ENCOUNTER — Other Ambulatory Visit: Payer: Self-pay | Admitting: Family Medicine

## 2020-07-18 ENCOUNTER — Ambulatory Visit (INDEPENDENT_AMBULATORY_CARE_PROVIDER_SITE_OTHER): Payer: 59 | Admitting: Nurse Practitioner

## 2020-07-18 ENCOUNTER — Encounter: Payer: Self-pay | Admitting: Nurse Practitioner

## 2020-07-18 DIAGNOSIS — U071 COVID-19: Secondary | ICD-10-CM | POA: Diagnosis not present

## 2020-07-18 LAB — POC COVID19 BINAXNOW: SARS Coronavirus 2 Ag: POSITIVE — AB

## 2020-07-18 MED ORDER — MOLNUPIRAVIR EUA 200MG CAPSULE: 4.0000 | ORAL_CAPSULE | Freq: Two times a day (BID) | ORAL | 0 refills | Status: AC

## 2020-07-18 NOTE — Progress Notes (Signed)
Virtual Visit via Video Note   This visit type was conducted due to national recommendations for restrictions regarding the COVID-19 Pandemic (e.g. social distancing) in an effort to limit this patient's exposure and mitigate transmission in our community.  Due to his co-morbid illnesses, this patient is at least at moderate risk for complications without adequate follow up.  This format is felt to be most appropriate for this patient at this time.  All issues noted in this document were discussed and addressed.  A limited physical exam was performed with this format.  A verbal consent was obtained for the virtual visit.   Date:  07/18/2020   ID:  Brent Williamson, DOB July 29, 1975, MRN [id number]  Patient Location: Home Provider Location: Office/Clinic  PCP:  Blane Ohara, MD   Evaluation Performed:  Follow-Up Visit  Chief Complaint:  Fever  History of Present Illness:    Brent Williamson is a 45 y.o. male with fever, sinus congestion, generalized body aches, and fatigue. Onset was 1-day-ago. Treatment has included Tylenol.   The patient does have symptoms concerning for COVID-19 infection (fever, chills, cough, or new shortness of breath).    Past Medical History:  Diagnosis Date   Chest pain    Dizziness    Dyspnea    Elevated blood pressure reading without diagnosis of hypertension    Hesitancy of micturition    Hyperlipidemia    Palpitations    Pedal edema    Precordial pain    Skin change    Urinary hesitancy     Past Surgical History:  Procedure Laterality Date   KIDNEY STONES      Family History  Problem Relation Age of Onset   Heart attack Mother    Diabetes Mother    Hypertension Mother    Heart attack Father    Diabetes Father    Healthy Sister    Healthy Sister     Social History   Socioeconomic History   Marital status: Divorced    Spouse name: Not on file   Number of children: Not on file   Years of education: Not on file   Highest  education level: Not on file  Occupational History   Not on file  Tobacco Use   Smoking status: Every Day    Packs/day: 1.00    Pack years: 0.00    Types: Cigarettes   Smokeless tobacco: Current    Types: Chew  Substance and Sexual Activity   Alcohol use: Yes    Comment: socially   Drug use: Not Currently   Sexual activity: Not on file  Other Topics Concern   Not on file  Social History Narrative   Not on file   Social Determinants of Health   Financial Resource Strain: Not on file  Food Insecurity: Not on file  Transportation Needs: Not on file  Physical Activity: Not on file  Stress: Not on file  Social Connections: Not on file  Intimate Partner Violence: Not on file    Outpatient Medications Prior to Visit  Medication Sig Dispense Refill   amoxicillin-clavulanate (AUGMENTIN) 875-125 MG tablet Take 1 tablet by mouth 2 (two) times daily. 20 tablet 0   esomeprazole (NEXIUM) 40 MG capsule TAKE 1 CAPSULE(40 MG) BY MOUTH TWICE DAILY BEFORE A MEAL 180 capsule 0   furosemide (LASIX) 20 MG tablet Take 20 mg by mouth.     losartan (COZAAR) 50 MG tablet TAKE 1 TABLET BY MOUTH EVERY DAY 90 tablet 1  meloxicam (MOBIC) 7.5 MG tablet Take 1 tablet (7.5 mg total) by mouth 2 (two) times daily. 60 tablet 0   rosuvastatin (CRESTOR) 10 MG tablet TAKE 1 TABLET BY MOUTH EVERY DAY 90 tablet 0   rosuvastatin (CRESTOR) 5 MG tablet Take 5 mg by mouth daily.     varenicline (CHANTIX) 0.5 MG tablet Take 1 tablet (0.5 mg total) by mouth 2 (two) times daily. 60 tablet 0   No facility-administered medications prior to visit.    Allergies:   Guaifenesin and Strawberry extract   Social History   Tobacco Use   Smoking status: Every Day    Packs/day: 1.00    Pack years: 0.00    Types: Cigarettes   Smokeless tobacco: Current    Types: Chew  Substance Use Topics   Alcohol use: Yes    Comment: socially   Drug use: Not Currently     Review of Systems  Constitutional:  Positive for chills,  fever and malaise/fatigue.  HENT:  Positive for congestion and sore throat.   Eyes: Negative.   Respiratory:  Positive for cough.   Cardiovascular: Negative.   Gastrointestinal: Negative.   Musculoskeletal:  Positive for myalgias (generalized body aches).  Skin:  Negative for rash.  Neurological:  Positive for headaches.  Endo/Heme/Allergies: Negative.     Labs/Other Tests and Data Reviewed:       Wt Readings from Last 3 Encounters:  02/19/20 (!) 350 lb (158.8 kg)  05/15/19 (!) 360 lb (163.3 kg)  04/16/19 (!) 367 lb (166.5 kg)     Objective:    Vital Signs:  There were no vitals taken for this visit.   Physical Exam No physical exam due to telemedicine visit  ASSESSMENT & PLAN:    1. COVID-19 - molnupiravir EUA 200 mg CAPS; Take 4 capsules (800 mg total) by mouth 2 (two) times daily for 5 days.  Dispense: 40 capsule; Refill: 0 - POC COVID-19   Rest and push fluids OTC medication for symptom management Seek emergency medical care for severe symptoms Follow-up as needed    COVID-19 Education: The signs and symptoms of COVID-19 were discussed with the patient and how to seek care for testing (follow up with PCP or arrange E-visit). The importance of social distancing was discussed today.   I spent 7 minutes dedicated to the care of this patient on the date of this encounter to include face-to-face time with the patient, as well as: EMR and prescription management.  Follow Up:  In Person prn  Signed, Janie Morning, NP  07/18/2020 12:02 PM    [facility] [place]

## 2021-05-12 ENCOUNTER — Encounter: Payer: Self-pay | Admitting: Nurse Practitioner

## 2021-05-12 ENCOUNTER — Telehealth (INDEPENDENT_AMBULATORY_CARE_PROVIDER_SITE_OTHER): Payer: Self-pay | Admitting: Nurse Practitioner

## 2021-05-12 VITALS — Ht 79.0 in | Wt 350.0 lb

## 2021-05-12 DIAGNOSIS — M272 Inflammatory conditions of jaws: Secondary | ICD-10-CM

## 2021-05-12 MED ORDER — AMOXICILLIN-POT CLAVULANATE 875-125 MG PO TABS: 1.0000 | ORAL_TABLET | Freq: Two times a day (BID) | ORAL | 0 refills | Status: DC

## 2021-05-12 MED ORDER — CHLORHEXIDINE GLUCONATE 0.12 % MT SOLN: 15.0000 mL | Freq: Two times a day (BID) | OROMUCOSAL | 0 refills | Status: DC

## 2021-05-12 NOTE — Progress Notes (Signed)
? ?Acute Office Visit ? ?Subjective:  ? ? Patient ID: Brent Williamson, male    DOB: 1975/08/10, 46 y.o.   MRN: [id number] ? ?Chief Complaint  ?Patient presents with  ? Dental Pain  ? ? ?HPI: ?Patient is in today for tooth abscess.patient states he first noticed the abscess on Monday, has got a lot worse since. Patient states it is taking over the right side of his face.  ? ?Past Medical History:  ?Diagnosis Date  ? Chest pain   ? Dizziness   ? Dyspnea   ? Elevated blood pressure reading without diagnosis of hypertension   ? Hesitancy of micturition   ? Hyperlipidemia   ? Palpitations   ? Pedal edema   ? Precordial pain   ? Skin change   ? Urinary hesitancy   ? ? ?Past Surgical History:  ?Procedure Laterality Date  ? KIDNEY STONES    ? ? ?Family History  ?Problem Relation Age of Onset  ? Heart attack Mother   ? Diabetes Mother   ? Hypertension Mother   ? Heart attack Father   ? Diabetes Father   ? Healthy Sister   ? Healthy Sister   ? ? ?Social History  ? ?Socioeconomic History  ? Marital status: Divorced  ?  Spouse name: Not on file  ? Number of children: Not on file  ? Years of education: Not on file  ? Highest education level: Not on file  ?Occupational History  ? Not on file  ?Tobacco Use  ? Smoking status: Every Day  ?  Packs/day: 1.00  ?  Types: Cigarettes  ? Smokeless tobacco: Current  ?  Types: Chew  ?Substance and Sexual Activity  ? Alcohol use: Yes  ?  Comment: socially  ? Drug use: Not Currently  ? Sexual activity: Not on file  ?Other Topics Concern  ? Not on file  ?Social History Narrative  ? Not on file  ? ?Social Determinants of Health  ? ?Financial Resource Strain: Not on file  ?Food Insecurity: Not on file  ?Transportation Needs: Not on file  ?Physical Activity: Not on file  ?Stress: Not on file  ?Social Connections: Not on file  ?Intimate Partner Violence: Not on file  ? ? ?Outpatient Medications Prior to Visit  ?Medication Sig Dispense Refill  ? esomeprazole (NEXIUM) 40 MG capsule TAKE 1  CAPSULE(40 MG) BY MOUTH TWICE DAILY BEFORE A MEAL 180 capsule 0  ? furosemide (LASIX) 20 MG tablet Take 20 mg by mouth.    ? losartan (COZAAR) 50 MG tablet TAKE 1 TABLET BY MOUTH EVERY DAY 90 tablet 1  ? meloxicam (MOBIC) 7.5 MG tablet Take 1 tablet (7.5 mg total) by mouth 2 (two) times daily. 60 tablet 0  ? rosuvastatin (CRESTOR) 10 MG tablet TAKE 1 TABLET BY MOUTH EVERY DAY 90 tablet 0  ? varenicline (CHANTIX) 0.5 MG tablet Take 1 tablet (0.5 mg total) by mouth 2 (two) times daily. 60 tablet 0  ? amoxicillin-clavulanate (AUGMENTIN) 875-125 MG tablet Take 1 tablet by mouth 2 (two) times daily. 20 tablet 0  ? rosuvastatin (CRESTOR) 5 MG tablet Take 5 mg by mouth daily.    ? ?No facility-administered medications prior to visit.  ? ? ?Allergies  ?Allergen Reactions  ? Guaifenesin Hives  ? Strawberry Extract Anaphylaxis  ? ? ?Review of Systems  ?Constitutional:  Negative for chills, fatigue, fever and unexpected weight change.  ?HENT:  Negative for congestion, rhinorrhea, sinus pressure, sneezing and sore throat.   ?  Eyes:  Negative for discharge and visual disturbance.  ?Respiratory:  Negative for cough, shortness of breath and wheezing.   ?Cardiovascular:  Negative for chest pain and palpitations.  ?Gastrointestinal:  Negative for abdominal pain, diarrhea, nausea and vomiting.  ?Endocrine: Negative for polydipsia, polyphagia and polyuria.  ?Genitourinary:  Negative for decreased urine volume, difficulty urinating, dysuria, frequency, penile swelling and urgency.  ?Musculoskeletal:  Negative for back pain, gait problem, joint swelling, neck pain and neck stiffness.  ?Neurological:  Negative for dizziness, seizures, weakness, numbness and headaches.  ?Psychiatric/Behavioral:  Negative for confusion, hallucinations, sleep disturbance and suicidal ideas. The patient is not nervous/anxious and is not hyperactive.   ? ?   ?Objective:  ?  ?Physical Exam ? ?There were no vitals taken for this visit. ?Wt Readings from Last 3  Encounters:  ?02/19/20 (!) 350 lb (158.8 kg)  ?05/15/19 (!) 360 lb (163.3 kg)  ?04/16/19 (!) 367 lb (166.5 kg)  ? ? ?Health Maintenance Due  ?Topic Date Due  ? HIV Screening  Never done  ? Hepatitis C Screening  Never done  ? COVID-19 Vaccine (2 - Pfizer series) 02/18/2020  ? TETANUS/TDAP  07/18/2020  ? ? ?There are no preventive care reminders to display for this patient. ? ? ?No results found for: TSH ?Lab Results  ?Component Value Date  ? WBC 9.6 04/16/2019  ? HGB 14.5 04/16/2019  ? HCT 43.1 04/16/2019  ? MCV 87 04/16/2019  ? PLT 306 04/16/2019  ? ?Lab Results  ?Component Value Date  ? NA 143 04/16/2019  ? K 4.9 04/16/2019  ? CO2 24 04/16/2019  ? GLUCOSE 88 04/16/2019  ? BUN 13 04/16/2019  ? CREATININE 0.91 04/16/2019  ? BILITOT 0.3 04/16/2019  ? ALKPHOS 112 04/16/2019  ? AST 23 04/16/2019  ? ALT 26 04/16/2019  ? PROT 6.5 04/16/2019  ? ALBUMIN 4.1 04/16/2019  ? CALCIUM 9.3 04/16/2019  ? ?No results found for: CHOL ?No results found for: HDL ?No results found for: LDLCALC ?No results found for: TRIG ?No results found for: CHOLHDL ?No results found for: HGBA1C ? ?   ?Assessment & Plan:  ? ?Problem List Items Addressed This Visit   ?None ? ?No orders of the defined types were placed in this encounter. ? ? ?No orders of the defined types were placed in this encounter. ?  ? ?Follow-up: No follow-ups on file. ? ?An After Visit Summary was printed and given to the patient. ? ?Janie Morning, NP ?[facility] ?[phone] ?

## 2021-05-12 NOTE — Progress Notes (Signed)
? ?Virtual Visit via Video Note  ? ?This visit type was conducted due to national recommendations for restrictions regarding the COVID-19 Pandemic (e.g. social distancing) in an effort to limit this patient's exposure and mitigate transmission in our community.  Due to his co-morbid illnesses, this patient is at least at moderate risk for complications without adequate follow up.  This format is felt to be most appropriate for this patient at this time.  All issues noted in this document were discussed and addressed.  A limited physical exam was performed with this format.  A verbal consent was obtained for the virtual visit.  ? ?Date:  05/12/2021  ? ?ID:  Brent Williamson, DOB 20-Feb-1975, MRN [id number] ? ?Patient Location: Home ?Provider Location: Office/Clinic ? ?PCP:  Blane Ohara, MD  ? ?Evaluation Performed:  Established patient, acute telemedicine visit ? ?Chief Complaint:  right lower jaw pain ? ?History of Present Illness:   ? ?Brent Williamson is a 46 y.o. male with right lower jaw pain. States he has an abscess tooth. Onset was 4-days ago. Treatment has included Tylenol and Ibuprofen. Reports he has not seen a dentist since the 90's. He is a cigarette smoker.  ? ?The patient does not have symptoms concerning for COVID-19 infection (fever, chills, cough, or new shortness of breath).  ? ? ?Past Medical History:  ?Diagnosis Date  ? Chest pain   ? Dizziness   ? Dyspnea   ? Elevated blood pressure reading without diagnosis of hypertension   ? Hesitancy of micturition   ? Hyperlipidemia   ? Palpitations   ? Pedal edema   ? Precordial pain   ? Skin change   ? Urinary hesitancy   ? ? ?Past Surgical History:  ?Procedure Laterality Date  ? KIDNEY STONES    ? ? ?Family History  ?Problem Relation Age of Onset  ? Heart attack Mother   ? Diabetes Mother   ? Hypertension Mother   ? Heart attack Father   ? Diabetes Father   ? Healthy Sister   ? Healthy Sister   ? ? ?Social History  ? ?Socioeconomic History  ?  Marital status: Divorced  ?  Spouse name: Not on file  ? Number of children: Not on file  ? Years of education: Not on file  ? Highest education level: Not on file  ?Occupational History  ? Not on file  ?Tobacco Use  ? Smoking status: Every Day  ?  Packs/day: 1.00  ?  Types: Cigarettes  ? Smokeless tobacco: Current  ?  Types: Chew  ?Substance and Sexual Activity  ? Alcohol use: Yes  ?  Comment: socially  ? Drug use: Not Currently  ? Sexual activity: Not on file  ?Other Topics Concern  ? Not on file  ?Social History Narrative  ? Not on file  ? ?Social Determinants of Health  ? ?Financial Resource Strain: Not on file  ?Food Insecurity: Not on file  ?Transportation Needs: Not on file  ?Physical Activity: Not on file  ?Stress: Not on file  ?Social Connections: Not on file  ?Intimate Partner Violence: Not on file  ? ? ?Outpatient Medications Prior to Visit  ?Medication Sig Dispense Refill  ? esomeprazole (NEXIUM) 40 MG capsule TAKE 1 CAPSULE(40 MG) BY MOUTH TWICE DAILY BEFORE A MEAL 180 capsule 0  ? furosemide (LASIX) 20 MG tablet Take 20 mg by mouth.    ? losartan (COZAAR) 50 MG tablet TAKE 1 TABLET BY MOUTH EVERY DAY 90 tablet  1  ? meloxicam (MOBIC) 7.5 MG tablet Take 1 tablet (7.5 mg total) by mouth 2 (two) times daily. 60 tablet 0  ? rosuvastatin (CRESTOR) 10 MG tablet TAKE 1 TABLET BY MOUTH EVERY DAY 90 tablet 0  ? varenicline (CHANTIX) 0.5 MG tablet Take 1 tablet (0.5 mg total) by mouth 2 (two) times daily. 60 tablet 0  ? amoxicillin-clavulanate (AUGMENTIN) 875-125 MG tablet Take 1 tablet by mouth 2 (two) times daily. 20 tablet 0  ? rosuvastatin (CRESTOR) 5 MG tablet Take 5 mg by mouth daily.    ? ?No facility-administered medications prior to visit.  ? ? ?Allergies:   Guaifenesin and Strawberry extract  ? ?Social History  ? ?Tobacco Use  ? Smoking status: Every Day  ?  Packs/day: 1.00  ?  Types: Cigarettes  ? Smokeless tobacco: Current  ?  Types: Chew  ?Substance Use Topics  ? Alcohol use: Yes  ?  Comment:  socially  ? Drug use: Not Currently  ?  ? ?Review of Systems  ?Constitutional:  Negative for fever.  ?HENT:    ?     Right lower jaw pain  ?Musculoskeletal:  Negative for joint pain and myalgias.  ?All other systems reviewed and are negative.  ? ?Labs/Other Tests and Data Reviewed:   ? ?Recent Labs: ?No results found for requested labs within last 8760 hours.  ? ?Recent Lipid Panel ?No results found for: CHOL, TRIG, HDL, CHOLHDL, LDLCALC, LDLDIRECT ? ?Wt Readings from Last 3 Encounters:  ?05/12/21 (!) 350 lb (158.8 kg)  ?02/19/20 (!) 350 lb (158.8 kg)  ?05/15/19 (!) 360 lb (163.3 kg)  ?  ? ?Objective:   ? ?Vital Signs:  Ht 6\' 7"  (2.007 m)   Wt (!) 350 lb (158.8 kg)   BMI 39.43 kg/m?   ? ?Physical Exam No physical exam due to telemedicine visit ? ?ASSESSMENT & PLAN:   ? ?  ?1. Odontogenic infection of jaw ?- amoxicillin-clavulanate (AUGMENTIN) 875-125 MG tablet; Take 1 tablet by mouth 2 (two) times daily.  Dispense: 20 tablet; Refill: 0 ?- chlorhexidine (PERIDEX) 0.12 % solution; Use as directed 15 mLs in the mouth or throat 2 (two) times daily.  Dispense: 120 mL; Refill: 0 ?  ? ?-contact dentist on Monday, April 17th for further management of tooth pain ?-good oral hygiene ?-consider smoking cessation ? ? ?COVID-19 Education: ?The signs and symptoms of COVID-19 were discussed with the patient and how to seek care for testing (follow up with PCP or arrange E-visit). The importance of social distancing was discussed today. ? ? ?I spent 10 minutes dedicated to the care of this patient on the date of this encounter to include face-to-face time with the patient, as well as: EMR review ans prescription medication managment ? ?Follow Up:  In Person prn ? ?Signed, ?April 19, NP  ?05/12/2021 11:19 AM    ?[facility] [place]  ?

## 2021-09-29 ENCOUNTER — Encounter: Payer: Self-pay | Admitting: Family Medicine

## 2021-09-29 ENCOUNTER — Ambulatory Visit (INDEPENDENT_AMBULATORY_CARE_PROVIDER_SITE_OTHER): Payer: Commercial Managed Care - PPO | Admitting: Family Medicine

## 2021-09-29 VITALS — BP 128/84 | HR 78 | Temp 95.1°F | Wt 358.0 lb

## 2021-09-29 DIAGNOSIS — R5383 Other fatigue: Secondary | ICD-10-CM

## 2021-09-29 DIAGNOSIS — E782 Mixed hyperlipidemia: Secondary | ICD-10-CM | POA: Diagnosis not present

## 2021-09-29 DIAGNOSIS — R030 Elevated blood-pressure reading, without diagnosis of hypertension: Secondary | ICD-10-CM

## 2021-09-29 DIAGNOSIS — R109 Unspecified abdominal pain: Secondary | ICD-10-CM

## 2021-09-29 DIAGNOSIS — R82998 Other abnormal findings in urine: Secondary | ICD-10-CM

## 2021-09-29 DIAGNOSIS — M545 Low back pain, unspecified: Secondary | ICD-10-CM

## 2021-09-29 DIAGNOSIS — Z6841 Body Mass Index (BMI) 40.0 and over, adult: Secondary | ICD-10-CM

## 2021-09-29 LAB — POCT URINALYSIS DIP (CLINITEK)
Blood, UA: NEGATIVE
Glucose, UA: NEGATIVE mg/dL
Ketones, POC UA: NEGATIVE mg/dL
Nitrite, UA: POSITIVE — AB
Spec Grav, UA: 1.015 (ref 1.010–1.025)
Urobilinogen, UA: 2 E.U./dL — AB
pH, UA: 7 (ref 5.0–8.0)

## 2021-09-29 MED ORDER — SULFAMETHOXAZOLE-TRIMETHOPRIM 800-160 MG PO TABS: 1.0000 | ORAL_TABLET | Freq: Two times a day (BID) | ORAL | 0 refills | Status: DC

## 2021-09-29 MED ORDER — CYCLOBENZAPRINE HCL 5 MG PO TABS: 5.0000 mg | ORAL_TABLET | Freq: Three times a day (TID) | ORAL | 1 refills | Status: DC | PRN

## 2021-09-29 NOTE — Progress Notes (Unsigned)
Subjective:  Patient ID: Brent Williamson, male    DOB: November 19, 1975  Age: 46 y.o. MRN: [id number]  Chief Complaint  Patient presents with   Back Pain   HPI: Brent Williamson comes in with a two week history of back pain.  He has a long history of kidney stones.  He reports no gross hematuria.  His symptoms have worsened and have become constant in the last 24 hours.  Denies abdominal pain, nausea, or vomiting.  No known injuries to back.    Current Outpatient Medications on File Prior to Visit  Medication Sig Dispense Refill   esomeprazole (NEXIUM) 40 MG capsule TAKE 1 CAPSULE(40 MG) BY MOUTH TWICE DAILY BEFORE A MEAL 180 capsule 0   furosemide (LASIX) 20 MG tablet Take 20 mg by mouth.     losartan (COZAAR) 50 MG tablet TAKE 1 TABLET BY MOUTH EVERY DAY 90 tablet 1   meloxicam (MOBIC) 7.5 MG tablet Take 1 tablet (7.5 mg total) by mouth 2 (two) times daily. 60 tablet 0   rosuvastatin (CRESTOR) 10 MG tablet TAKE 1 TABLET BY MOUTH EVERY DAY 90 tablet 0   varenicline (CHANTIX) 0.5 MG tablet Take 1 tablet (0.5 mg total) by mouth 2 (two) times daily. 60 tablet 0   No current facility-administered medications on file prior to visit.   Past Medical History:  Diagnosis Date   Chest pain    Dizziness    Dyspnea    Elevated blood pressure reading without diagnosis of hypertension    Hesitancy of micturition    Hyperlipidemia    Palpitations    Pedal edema    Precordial pain    Skin change    Urinary hesitancy    Past Surgical History:  Procedure Laterality Date   KIDNEY STONES      Family History  Problem Relation Age of Onset   Heart attack Mother    Diabetes Mother    Hypertension Mother    Heart attack Father    Diabetes Father    Healthy Sister    Healthy Sister    Social History   Socioeconomic History   Marital status: Divorced    Spouse name: Not on file   Number of children: Not on file   Years of education: Not on file   Highest education level: Not on file   Occupational History   Not on file  Tobacco Use   Smoking status: Every Day    Packs/day: 1.00    Types: Cigarettes   Smokeless tobacco: Current    Types: Chew  Substance and Sexual Activity   Alcohol use: Yes    Comment: socially   Drug use: Not Currently   Sexual activity: Not on file  Other Topics Concern   Not on file  Social History Narrative   Not on file   Social Determinants of Health   Financial Resource Strain: Not on file  Food Insecurity: Not on file  Transportation Needs: Not on file  Physical Activity: Not on file  Stress: Not on file  Social Connections: Not on file    Review of Systems  Constitutional:  Negative for chills and fever.  Gastrointestinal:  Negative for nausea and vomiting.  Musculoskeletal:  Positive for back pain.     Objective:  BP 128/84   Pulse 78   Temp (!) 95.1 F (35.1 C)   Wt (!) 358 lb (162.4 kg)   BMI 40.33 kg/m      09/29/2021   10:36 AM  09/29/2021   10:25 AM 05/12/2021   11:08 AM  BP/Weight  Systolic BP 128 148   Diastolic BP 84 90   Wt. (Lbs)  358 350  BMI  40.33 kg/m2 39.43 kg/m2    Physical Exam Vitals reviewed.  Constitutional:      Appearance: Normal appearance.  Neck:     Vascular: No carotid bruit.  Cardiovascular:     Rate and Rhythm: Normal rate and regular rhythm.     Heart sounds: Normal heart sounds.  Pulmonary:     Effort: Pulmonary effort is normal.     Breath sounds: Normal breath sounds. No wheezing, rhonchi or rales.  Abdominal:     General: Bowel sounds are normal.     Palpations: Abdomen is soft.     Tenderness: There is no abdominal tenderness.  Musculoskeletal:        General: Tenderness (mild lumbar, but more deep.) present.  Neurological:     Mental Status: He is alert.  Psychiatric:        Mood and Affect: Mood normal.        Behavior: Behavior normal.     Diabetic Foot Exam - Simple   No data filed      Lab Results  Component Value Date   WBC 10.4 09/29/2021   HGB  15.1 09/29/2021   HCT 44.2 09/29/2021   PLT 260 09/29/2021   GLUCOSE 89 09/29/2021   CHOL 156 09/29/2021   TRIG 96 09/29/2021   HDL 32 (L) 09/29/2021   LDLCALC 106 (H) 09/29/2021   ALT 29 09/29/2021   AST 21 09/29/2021   NA 140 09/29/2021   K 4.7 09/29/2021   CL 103 09/29/2021   CREATININE 0.90 09/29/2021   BUN 12 09/29/2021   CO2 21 09/29/2021   TSH 1.590 09/29/2021   HGBA1C 5.7 (H) 09/29/2021      Assessment & Plan:   Problem List Items Addressed This Visit       Other   Lumbar back pain    Start cyclobenaprine 5 mg three times a day as needed.      Relevant Medications   cyclobenzaprine (FLEXERIL) 5 MG tablet   Flank pain - Primary    Check UA.        Relevant Orders   POCT URINALYSIS DIP (CLINITEK) (Completed)   Mixed hyperlipidemia    Check labs.  Recommend continue to work on eating healthy diet and exercise.        Relevant Orders   Lipid panel (Completed)   Morbid obesity with BMI of 40.0-44.9, adult (HCC)    Check labs.       Relevant Orders   CBC with Differential/Platelet (Completed)   Comprehensive metabolic panel (Completed)   Hemoglobin A1c (Completed)   Lipid panel (Completed)   TSH (Completed)   Leukocytes in urine    Start on bactrim ds.  Send for urine culture.       Relevant Orders   Urine Culture (Completed)   Fatigue    Check labs.       Relevant Orders   Vitamin D, 25-hydroxy (Completed)   Testosterone,Free and Total   RESOLVED: Elevated BP without diagnosis of hypertension  .  Meds ordered this encounter  Medications   sulfamethoxazole-trimethoprim (BACTRIM DS) 800-160 MG tablet    Sig: Take 1 tablet by mouth 2 (two) times daily.    Dispense:  10 tablet    Refill:  0   cyclobenzaprine (FLEXERIL) 5 MG tablet  Sig: Take 1 tablet (5 mg total) by mouth 3 (three) times daily as needed for muscle spasms.    Dispense:  60 tablet    Refill:  1    Orders Placed This Encounter  Procedures   Urine Culture   CBC  with Differential/Platelet   Comprehensive metabolic panel   Hemoglobin A1c   Lipid panel   TSH   Vitamin D, 25-hydroxy   Testosterone,Free and Total   POCT URINALYSIS DIP (CLINITEK)     Follow-up: Return in about 3 months (around 12/29/2021) for chronic fasting.  An After Visit Summary was printed and given to the patient.  Blane Ohara, MD [facility] [phone]

## 2021-09-29 NOTE — Patient Instructions (Signed)
Start on cyclobenzaprine for back pain. Can be sedating. 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   Start on bactrim ds one oral twice daily x 5 days for possible urinary tract infection.

## 2021-09-30 LAB — CBC WITH DIFFERENTIAL/PLATELET
Basophils Absolute: 0.1 10*3/uL (ref 0.0–0.2)
Basos: 1 %
EOS (ABSOLUTE): 0.1 10*3/uL (ref 0.0–0.4)
Eos: 1 %
Hematocrit: 44.2 % (ref 37.5–51.0)
Hemoglobin: 15.1 g/dL (ref 13.0–17.7)
Immature Grans (Abs): 0 10*3/uL (ref 0.0–0.1)
Immature Granulocytes: 0 %
Lymphocytes Absolute: 1.8 10*3/uL (ref 0.7–3.1)
Lymphs: 17 %
MCH: 30.4 pg (ref 26.6–33.0)
MCHC: 34.2 g/dL (ref 31.5–35.7)
MCV: 89 fL (ref 79–97)
Monocytes Absolute: 0.6 10*3/uL (ref 0.1–0.9)
Monocytes: 6 %
Neutrophils Absolute: 7.8 10*3/uL — ABNORMAL HIGH (ref 1.4–7.0)
Neutrophils: 75 %
Platelets: 260 10*3/uL (ref 150–450)
RBC: 4.96 x10E6/uL (ref 4.14–5.80)
RDW: 12.1 % (ref 11.6–15.4)
WBC: 10.4 10*3/uL (ref 3.4–10.8)

## 2021-09-30 LAB — LIPID PANEL
Chol/HDL Ratio: 4.9 ratio (ref 0.0–5.0)
Cholesterol, Total: 156 mg/dL (ref 100–199)
HDL: 32 mg/dL — ABNORMAL LOW (ref >39)
LDL Chol Calc (NIH): 106 mg/dL — ABNORMAL HIGH (ref 0–99)
Triglycerides: 96 mg/dL (ref 0–149)
VLDL Cholesterol Cal: 18 mg/dL (ref 5–40)

## 2021-09-30 LAB — CARDIOVASCULAR RISK ASSESSMENT

## 2021-09-30 LAB — COMPREHENSIVE METABOLIC PANEL
ALT: 29 IU/L (ref 0–44)
AST: 21 IU/L (ref 0–40)
Albumin/Globulin Ratio: 2 (ref 1.2–2.2)
Albumin: 4.2 g/dL (ref 4.1–5.1)
Alkaline Phosphatase: 94 IU/L (ref 44–121)
BUN/Creatinine Ratio: 13 (ref 9–20)
BUN: 12 mg/dL (ref 6–24)
Bilirubin Total: 0.6 mg/dL (ref 0.0–1.2)
CO2: 21 mmol/L (ref 20–29)
Calcium: 9.5 mg/dL (ref 8.7–10.2)
Chloride: 103 mmol/L (ref 96–106)
Creatinine, Ser: 0.9 mg/dL (ref 0.76–1.27)
Globulin, Total: 2.1 g/dL (ref 1.5–4.5)
Glucose: 89 mg/dL (ref 70–99)
Potassium: 4.7 mmol/L (ref 3.5–5.2)
Sodium: 140 mmol/L (ref 134–144)
Total Protein: 6.3 g/dL (ref 6.0–8.5)
eGFR: 106 mL/min/{1.73_m2} (ref >59)

## 2021-09-30 LAB — TSH: TSH: 1.59 u[IU]/mL (ref 0.450–4.500)

## 2021-09-30 LAB — HEMOGLOBIN A1C
Est. average glucose Bld gHb Est-mCnc: 117 mg/dL
Hgb A1c MFr Bld: 5.7 % — ABNORMAL HIGH (ref 4.8–5.6)

## 2021-09-30 LAB — VITAMIN D 25 HYDROXY (VIT D DEFICIENCY, FRACTURES): Vit D, 25-Hydroxy: 38.5 ng/mL (ref 30.0–100.0)

## 2021-10-01 LAB — URINE CULTURE: Organism ID, Bacteria: NO GROWTH

## 2021-10-02 DIAGNOSIS — R5383 Other fatigue: Secondary | ICD-10-CM | POA: Insufficient documentation

## 2021-10-02 NOTE — Assessment & Plan Note (Signed)
Start cyclobenaprine 5 mg three times a day as needed.

## 2021-10-02 NOTE — Assessment & Plan Note (Signed)
Check labs. Recommend continue to work on eating healthy diet and exercise. ° °

## 2021-10-02 NOTE — Assessment & Plan Note (Signed)
Check labs 

## 2021-10-02 NOTE — Assessment & Plan Note (Signed)
Check UA 

## 2021-10-02 NOTE — Assessment & Plan Note (Addendum)
Start on bactrim ds.  Send for urine culture.

## 2021-10-04 LAB — TESTOSTERONE,FREE AND TOTAL
Testosterone, Free: 4.5 pg/mL — ABNORMAL LOW (ref 6.8–21.5)
Testosterone: 380 ng/dL (ref 264–916)

## 2021-10-06 ENCOUNTER — Other Ambulatory Visit: Payer: Self-pay

## 2021-10-06 ENCOUNTER — Other Ambulatory Visit: Payer: Commercial Managed Care - PPO

## 2021-10-06 DIAGNOSIS — R7989 Other specified abnormal findings of blood chemistry: Secondary | ICD-10-CM

## 2021-10-12 LAB — TESTOSTERONE,FREE AND TOTAL
Testosterone, Free: 8.9 pg/mL (ref 6.8–21.5)
Testosterone: 401 ng/dL (ref 264–916)

## 2022-01-04 NOTE — Progress Notes (Deleted)
Subjective:  Patient ID: Brent Williamson, male    DOB: 05-29-1975  Age: 46 y.o. MRN: [id number]  Chief Complaint  Patient presents with   Hyperlipidemia    Hyperlipidemia:      Current Outpatient Medications on File Prior to Visit  Medication Sig Dispense Refill   cyclobenzaprine (FLEXERIL) 5 MG tablet Take 1 tablet (5 mg total) by mouth 3 (three) times daily as needed for muscle spasms. 60 tablet 1   esomeprazole (NEXIUM) 40 MG capsule TAKE 1 CAPSULE(40 MG) BY MOUTH TWICE DAILY BEFORE A MEAL 180 capsule 0   furosemide (LASIX) 20 MG tablet Take 20 mg by mouth.     losartan (COZAAR) 50 MG tablet TAKE 1 TABLET BY MOUTH EVERY DAY 90 tablet 1   meloxicam (MOBIC) 7.5 MG tablet Take 1 tablet (7.5 mg total) by mouth 2 (two) times daily. 60 tablet 0   rosuvastatin (CRESTOR) 10 MG tablet TAKE 1 TABLET BY MOUTH EVERY DAY 90 tablet 0   sulfamethoxazole-trimethoprim (BACTRIM DS) 800-160 MG tablet Take 1 tablet by mouth 2 (two) times daily. 10 tablet 0   varenicline (CHANTIX) 0.5 MG tablet Take 1 tablet (0.5 mg total) by mouth 2 (two) times daily. 60 tablet 0   No current facility-administered medications on file prior to visit.   Past Medical History:  Diagnosis Date   Chest pain    Dizziness    Dyspnea    Elevated blood pressure reading without diagnosis of hypertension    Hesitancy of micturition    Hyperlipidemia    Palpitations    Pedal edema    Precordial pain    Skin change    Urinary hesitancy    Past Surgical History:  Procedure Laterality Date   KIDNEY STONES      Family History  Problem Relation Age of Onset   Heart attack Mother    Diabetes Mother    Hypertension Mother    Heart attack Father    Diabetes Father    Healthy Sister    Healthy Sister    Social History   Socioeconomic History   Marital status: Divorced    Spouse name: Not on file   Number of children: Not on file   Years of education: Not on file   Highest education level: Not on file   Occupational History   Not on file  Tobacco Use   Smoking status: Every Day    Packs/day: 1.00    Types: Cigarettes   Smokeless tobacco: Current    Types: Chew  Substance and Sexual Activity   Alcohol use: Yes    Comment: socially   Drug use: Not Currently   Sexual activity: Not on file  Other Topics Concern   Not on file  Social History Narrative   Not on file   Social Determinants of Health   Financial Resource Strain: Not on file  Food Insecurity: Not on file  Transportation Needs: Not on file  Physical Activity: Not on file  Stress: Not on file  Social Connections: Not on file    Review of Systems  Constitutional:  Negative for appetite change, fatigue and fever.  HENT:  Negative for congestion, ear pain, sinus pressure and sore throat.   Respiratory:  Negative for cough, chest tightness, shortness of breath and wheezing.   Cardiovascular:  Negative for chest pain and palpitations.  Gastrointestinal:  Negative for abdominal pain, constipation, diarrhea, nausea and vomiting.  Genitourinary:  Negative for dysuria and hematuria.  Musculoskeletal:  Negative for arthralgias, back pain, joint swelling and myalgias.  Skin:  Negative for rash.  Neurological:  Negative for dizziness, weakness and headaches.  Psychiatric/Behavioral:  Negative for dysphoric mood. The patient is not nervous/anxious.      Objective:  There were no vitals taken for this visit.     09/29/2021   10:36 AM 09/29/2021   10:25 AM 05/12/2021   11:08 AM  BP/Weight  Systolic BP 128 148   Diastolic BP 84 90   Wt. (Lbs)  358 350  BMI  40.33 kg/m2 39.43 kg/m2    Physical Exam Vitals reviewed.  Constitutional:      Appearance: Normal appearance. He is normal weight.  Cardiovascular:     Rate and Rhythm: Normal rate and regular rhythm.     Heart sounds: Normal heart sounds.  Pulmonary:     Effort: Pulmonary effort is normal.     Breath sounds: Normal breath sounds.  Abdominal:     General:  Abdomen is flat. Bowel sounds are normal.     Palpations: Abdomen is soft.  Neurological:     Mental Status: He is alert and oriented to person, place, and time.  Psychiatric:        Mood and Affect: Mood normal.        Behavior: Behavior normal.     Diabetic Foot Exam - Simple   No data filed      Lab Results  Component Value Date   WBC 10.4 09/29/2021   HGB 15.1 09/29/2021   HCT 44.2 09/29/2021   PLT 260 09/29/2021   GLUCOSE 89 09/29/2021   CHOL 156 09/29/2021   TRIG 96 09/29/2021   HDL 32 (L) 09/29/2021   LDLCALC 106 (H) 09/29/2021   ALT 29 09/29/2021   AST 21 09/29/2021   NA 140 09/29/2021   K 4.7 09/29/2021   CL 103 09/29/2021   CREATININE 0.90 09/29/2021   BUN 12 09/29/2021   CO2 21 09/29/2021   TSH 1.590 09/29/2021   HGBA1C 5.7 (H) 09/29/2021      Assessment & Plan:   Problem List Items Addressed This Visit       Other   Mixed hyperlipidemia - Primary   Morbid obesity with BMI of 40.0-44.9, adult (HCC)  .  No orders of the defined types were placed in this encounter.   No orders of the defined types were placed in this encounter.    Follow-up: No follow-ups on file.  An After Visit Summary was printed and given to the patient.  Blane Ohara, MD [facility] [phone]

## 2022-01-05 ENCOUNTER — Encounter: Payer: Commercial Managed Care - PPO | Admitting: Family Medicine

## 2022-01-05 DIAGNOSIS — E782 Mixed hyperlipidemia: Secondary | ICD-10-CM

## 2022-01-07 NOTE — Progress Notes (Signed)
This encounter was created in error - please disregard.

## 2024-01-04 NOTE — ED Provider Notes (Signed)
 Emergency Department Provider Note   Medical Decision Making    Brent Williamson is a pleasant 48 y.o. male w/ a history of tobacco use disorder (ongoing smoker), BMI 41, OSA who presented with cough, chest tightness, shortness of breath.    On presentation, vitals were hypertensive to the 150s/110s but otherwise WNL on room air. Physical exam revealed diffuse expiratory wheezing.   RVP positive for rhinovirus.  Chest x-ray negative for acute changes.  EKG with normal rate, normal sinus rhythm, normal axis, normal intervals, and no significant ST segment deviations  Final impression was acute wheezing illness potentially secondary to viral lower respiratory infection versus first time COPD exacerbation.  I began treatment with DuoNeb and prednisone  and afterward patient reported significant improvement.  No longer short of breath or with chest tightness.  I advised that patient stop smoking, follow-up with a primary care doctor for obtaining PFTs and possible referral to pulmonology, and discharged with prescription of albuterol and prednisone .  Reviewed return precautions and discharged home in stable condition.  Historians interviewed Patient  Studies independently reviewed  EKG as noted above  Consultants involved  None  Prior records reviewed  None  Testing considered None  Social factors affecting disposition  None  Disposition  Safe for discharge home   History   HPI: Brent Williamson is a pleasant 48 y.o. male w/ a history of tobacco use disorder (ongoing smoker), BMI 41, OSA who presented with cough, chest tightness, shortness of breath.  He is accompanied by his significant other.  He reports that he began to get sick about 3 days ago.  He says that he has a nonproductive cough and feels like his chest is tight.  He denies fever, vomiting, diarrhea.  He endorses chronic mild lower extremity edema that is not different from baseline.  He reports that he has some chest  tightness predominantly with coughing.  Currently chest pain-free.  Reports that he has never been told that he had COPD or emphysema before.  Physical Exam   Vitals:  Vitals:   01/04/24 1849 01/04/24 2155 01/04/24 2246  BP: 156/110  132/74  Pulse: 78 78 80  Resp: 20 20 22   Temp: 36.3 C (97.4 F)  36.3 C (97.4 F)  TempSrc: Temporal  Oral  SpO2: 94% 95% 96%  Weight: (!) 167.8 kg (370 lb)    Height: 200.7 cm (6' 7)       Physical Exam:  General: Chronically ill-appearing adult male in no apparent acute distress.  Alert.  Nondiaphoretic. Head: Atraumatic and normocephalic.   Eyes: Conjugate gaze. No scleral icterus or conjunctivitis.   ENT: Moist mucous membranes. No stridor. Controlling secretions well.   Cardiac: RRR. No MRG.   Pulmonary: Breathing comfortably w/ no accessory muscle usage. Normal inspiratory:expiratory ratio.  Diffuse expiratory wheezing noted Abdominal: Soft, non-distended, non-tender abdomen.   GU: Deferred  MSK: No pitting edema Dermatologic: No jaundice, cyanosis, pallor, or rashes visible on exposed skin.   Neurologic: Alert. Follows commands. Moving all extremities spontaneously.   Psychiatric: Normal mood and behavior.      Camellia [place], MD  Clinical Assistant Professor [facility]           [place] Camellia Mussel, MD 01/04/24 2258

## 2024-01-06 ENCOUNTER — Ambulatory Visit: Payer: Self-pay

## 2024-01-06 NOTE — Telephone Encounter (Signed)
 Spoke with Shelburn, she stated she thinks he needs to be seen before the 17 th, patient is coming tomorrow at 3:40 to been sene for a hosptial follow up with Dr. Sherre.

## 2024-01-06 NOTE — Telephone Encounter (Signed)
  FYI Only or Action Required?: FYI only for provider: appointment scheduled on 01/15/2024.  Patient was last seen in primary care on 09/29/2021 by Sherre Clapper, MD.  Called Nurse Triage reporting Shortness of Breath and Appointment.  Symptoms began several days ago.  Interventions attempted: Prescription medications: reported oral steroid, nebulizer tx.  Symptoms are: rapidly improving.  Triage Disposition: No disposition on file.  Patient/caregiver understands and will follow disposition?: Yes  Copied from CRM [id number]. Topic: Clinical - Red Word Triage >> Jan 06, 2024  9:23 AM Tiffany B wrote: Red Word that prompted transfer to Nurse Triage: Patient experiencing difficulty breathing. Caller is not with patient at this time. Reason for Disposition  [1] MILD longstanding difficulty breathing (e.g., minimal/no SOB at rest, SOB with walking, pulse < 100) AND [2] SAME as normal  Answer Assessment - Initial Assessment Questions Patient's fiance called to schedule hospital follow up appointment.  Disconnected before warm transfer.  Contacted patient directly.  Declines triage.   Contacted fiance, Beth, at patient's request to schedule appointment for ED follow up  Protocols used: Breathing Difficulty-A-AH

## 2024-01-07 ENCOUNTER — Encounter: Payer: Self-pay | Admitting: Family Medicine

## 2024-01-07 ENCOUNTER — Ambulatory Visit: Admitting: Family Medicine

## 2024-01-07 VITALS — BP 160/88 | HR 86 | Temp 97.8°F | Ht 79.0 in | Wt 350.0 lb

## 2024-01-07 DIAGNOSIS — Z23 Encounter for immunization: Secondary | ICD-10-CM

## 2024-01-07 DIAGNOSIS — I10 Essential (primary) hypertension: Secondary | ICD-10-CM

## 2024-01-07 DIAGNOSIS — E782 Mixed hyperlipidemia: Secondary | ICD-10-CM

## 2024-01-07 MED ORDER — BREZTRI AEROSPHERE 160-9-4.8 MCG/ACT IN AERO: 2.0000 | INHALATION_SPRAY | Freq: Two times a day (BID) | RESPIRATORY_TRACT | Status: DC

## 2024-01-07 MED ORDER — BREZTRI AEROSPHERE 160-9-4.8 MCG/ACT IN AERO: 2.0000 | INHALATION_SPRAY | Freq: Two times a day (BID) | RESPIRATORY_TRACT | 5 refills | Status: DC

## 2024-01-07 MED ORDER — AZITHROMYCIN 250 MG PO TABS: ORAL_TABLET | ORAL | 0 refills | Status: DC

## 2024-01-07 MED ORDER — PREDNISONE 50 MG PO TABS: 50.0000 mg | ORAL_TABLET | Freq: Every day | ORAL | 0 refills | Status: DC

## 2024-01-07 MED ORDER — VALSARTAN-HYDROCHLOROTHIAZIDE 80-12.5 MG PO TABS: 1.0000 | ORAL_TABLET | Freq: Every day | ORAL | 0 refills | Status: AC

## 2024-01-07 NOTE — Patient Instructions (Addendum)
 COPD exacerbation  Start Breztri  2 puffs twice daily. Start Zithromax  (antibiotic) Continue albuterol 2 puffs 4 times daily as needed wheezing. Continue prednisone  50 mg once daily for 5 days Encouraged to quit smoking   Hypertension Start valsartan /HCTZ 80/12.5 mg once daily.  Your EKG was normal.  I am concerned the chest discomfort you are having is related to your high blood pressure.  Cardiac chest pain is usually exertional.  Recommend follow-up in 2 to 3 weeks.

## 2024-01-07 NOTE — Progress Notes (Unsigned)
 Subjective:  Patient ID: Brent Williamson, male    DOB: Apr 17, 1975  Age: 48 y.o. MRN: [id number]  Chief Complaint  Patient presents with   Hospitalization Follow-up    HPI: Discussed the use of AI scribe software for clinical note transcription with the patient, who gave verbal consent to proceed.  History of Present Illness Brent Williamson is a 48 year old male with COPD who presents with shortness of breath and chest pain.  Dyspnea - Persistent shortness of breath, described as breathing through a coffee straw - Recent emergency department visit for evaluation of COPD exacerbation - Chest x-ray negative for pneumonia - Diagnosed with rhinovirus - Treated with prednisone  (four tablets daily for four days and has one day left) and albuterol inhaler (two puffs four times daily as needed) - No antibiotics administered  Chest pain - Intermittent episodes of chest pain without exertion - Pain described as localized, non-radiating, and feeling like being grabbed and ripped - Duration ranges from a few minutes to fifteen minutes - Occurs every three to four weeks - No associated nausea - Occasional diaphoresis during episodes - No medication taken for chest pain  Cough - Cough present, body aches from coughing - Cough has become looser since recent treatment - No fever, chills, or current sore throat  Dizziness - Dizziness when standing up quickly or after excessive coughing  Hypertension - History of high blood pressure - Not currently on antihypertensive medication - Previously treated with blood pressure medication, but not currently taking any - No regular blood pressure monitoring - Elevated blood pressure during recent hospital visit, which subsequently normalized  Functional status - Works as a naval architect, 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 0  Number falls in past yr: 0  Injury with Fall? 0      Data saved with a previous flowsheet row definition    Patient Care Team: Etrulia Zarr, Abigail, MD as PCP - General (Family Medicine)   Review of Systems  Constitutional:  Negative for chills, fatigue and fever.  HENT:  Negative for congestion, ear pain and sore throat.   Respiratory:  Positive for cough and shortness of breath.   Cardiovascular:  Negative for chest pain.  Gastrointestinal:  Negative for abdominal pain, constipation, diarrhea, nausea and vomiting.  Endocrine: Negative for polydipsia, polyphagia and polyuria.  Genitourinary:  Negative for dysuria and frequency.  Musculoskeletal:  Negative for arthralgias and myalgias.  Neurological:  Negative for dizziness and headaches.  Psychiatric/Behavioral:  Negative for dysphoric mood.        No dysphoria    Current Outpatient Medications on File Prior to Visit  Medication Sig Dispense Refill   albuterol (VENTOLIN HFA) 108 (90 Base) MCG/ACT inhaler Inhale 2 puffs into the lungs every 6 (six) hours as needed.     predniSONE  (DELTASONE ) 10 MG tablet Take 40 mg by mouth daily.     No current facility-administered medications on file prior to visit.   Past Medical History:  Diagnosis Date   Chest pain    Dizziness    Dyspnea    Elevated blood pressure reading without diagnosis of hypertension    Hesitancy of micturition  Hyperlipidemia    Palpitations    Pedal edema    Precordial pain    Skin change    Urinary hesitancy    Past Surgical History:  Procedure Laterality Date   KIDNEY STONES      Family History  Problem Relation Age of Onset   Heart attack Mother    Diabetes Mother    Hypertension Mother    Heart attack Father    Diabetes Father    Healthy Sister    Healthy Sister    Social History   Socioeconomic History   Marital status: Divorced     Spouse name: Not on file   Number of children: Not on file   Years of education: Not on file   Highest education level: Not on file  Occupational History   Not on file  Tobacco Use   Smoking status: Every Day    Current packs/day: 1.00    Types: Cigarettes   Smokeless tobacco: Current    Types: Chew  Substance and Sexual Activity   Alcohol use: Yes    Comment: socially   Drug use: Not Currently   Sexual activity: Not on file  Other Topics Concern   Not on file  Social History Narrative   Not on file   Social Drivers of Health   Tobacco Use: High Risk (01/07/2024)   Patient History    Smoking Tobacco Use: Every Day    Smokeless Tobacco Use: Current    Passive Exposure: Not on file  Financial Resource Strain: Low Risk (01/07/2024)   Overall Financial Resource Strain (CARDIA)    Difficulty of Paying Living Expenses: Not hard at all  Food Insecurity: No Food Insecurity (01/07/2024)   Epic    Worried About Radiation Protection Practitioner of Food in the Last Year: Never true    Ran Out of Food in the Last Year: Never true  Transportation Needs: No Transportation Needs (01/07/2024)   Epic    Lack of Transportation (Medical): No    Lack of Transportation (Non-Medical): No  Physical Activity: Inactive (01/07/2024)   Exercise Vital Sign    Days of Exercise per Week: 0 days    Minutes of Exercise per Session: 0 min  Stress: No Stress Concern Present (01/07/2024)   [employer] of Occupational Health - Occupational Stress Questionnaire    Feeling of Stress: Not at all  Social Connections: Socially Isolated (01/07/2024)   Social Connection and Isolation Panel    Frequency of Communication with Friends and Family: Twice a week    Frequency of Social Gatherings with Friends and Family: Never    Attends Religious Services: Never    Database Administrator or Organizations: No    Attends Banker Meetings: Never    Marital Status: Divorced  Depression (PHQ2-9): Low Risk (05/12/2021)    Depression (PHQ2-9)    PHQ-2 Score: 0  Alcohol Screen: Medium Risk (01/07/2024)   Alcohol Screen    Last Alcohol Screening Score (AUDIT): 8  Housing: Low Risk (01/07/2024)   Epic    Unable to Pay for Housing in the Last Year: No    Number of Times Moved in the Last Year: 0    Homeless in the Last Year: No  Utilities: Not At Risk (01/07/2024)   Epic    Threatened with loss of utilities: No  Health Literacy: Adequate Health Literacy (01/07/2024)   B1300 Health Literacy    Frequency of need for help with medical instructions: Never    Objective:  BP (!) 160/88   Pulse 86   Temp 97.8 F (36.6 C)   Ht 6' 7 (2.007 m)   Wt (!) 350 lb (158.8 kg)   SpO2 96%   BMI 39.43 kg/m      01/07/2024    4:22 PM 01/07/2024    3:37 PM 09/29/2021   10:36 AM  BP/Weight  Systolic BP 160 178 128  Diastolic BP 88 96 84  Wt. (Lbs)  350   BMI  39.43 kg/m2     Physical Exam Constitutional:      Appearance: Normal appearance. He is obese.  HENT:     Right Ear: Tympanic membrane, ear canal and external ear normal.     Left Ear: Tympanic membrane, ear canal and external ear normal.     Nose: Nose normal. No congestion or rhinorrhea.     Mouth/Throat:     Mouth: Mucous membranes are moist.     Pharynx: No oropharyngeal exudate or posterior oropharyngeal erythema.  Cardiovascular:     Rate and Rhythm: Normal rate and regular rhythm.     Heart sounds: Normal heart sounds.  Pulmonary:     Effort: Pulmonary effort is normal. No respiratory distress.     Breath sounds: Wheezing present. No rhonchi or rales.  Abdominal:     General: Bowel sounds are normal.     Palpations: Abdomen is soft.     Tenderness: There is no abdominal tenderness.  Lymphadenopathy:     Cervical: No cervical adenopathy.  Neurological:     Mental Status: He is alert and oriented to person, place, and time.  Psychiatric:        Mood and Affect: Mood normal.        Behavior: Behavior normal.         Lab Results   Component Value Date   WBC 18.2 (H) 01/07/2024   HGB 15.4 01/07/2024   HCT 45.7 01/07/2024   PLT 325 01/07/2024   GLUCOSE 93 01/07/2024   CHOL 182 01/07/2024   TRIG 95 01/07/2024   HDL 33 (L) 01/07/2024   LDLCALC 131 (H) 01/07/2024   ALT 31 01/07/2024   AST 23 01/07/2024   NA 141 01/07/2024   K 4.4 01/07/2024   CL 103 01/07/2024   CREATININE 1.13 01/07/2024   BUN 18 01/07/2024   CO2 26 01/07/2024   TSH 2.420 01/07/2024   HGBA1C 5.7 (H) 09/29/2021    Results for orders placed or performed in visit on 01/07/24  CBC with Differential/Platelet   Collection Time: 01/07/24  4:39 PM  Result Value Ref Range   WBC 18.2 (H) 3.4 - 10.8 x10E3/uL   RBC 4.90 4.14 - 5.80 x10E6/uL   Hemoglobin 15.4 13.0 - 17.7 g/dL   Hematocrit 54.2 62.4 - 51.0 %   MCV 93 79 - 97 fL   MCH 31.4 26.6 - 33.0 pg   MCHC 33.7 31.5 - 35.7 g/dL   RDW 87.6 88.3 - 84.5 %   Platelets 325 150 - 450 x10E3/uL   Neutrophils 75 Not Estab. %   Lymphs 18 Not Estab. %   Monocytes 7 Not Estab. %   Eos 0 Not Estab. %   Basos 0 Not Estab. %   Neutrophils Absolute 13.5 (H) 1.4 - 7.0 x10E3/uL   Lymphocytes Absolute 3.2 (H) 0.7 - 3.1 x10E3/uL   Monocytes Absolute 1.4 (H) 0.1 - 0.9 x10E3/uL   EOS (ABSOLUTE) 0.1 0.0 - 0.4 x10E3/uL   Basophils Absolute 0.1 0.0 - 0.2 x10E3/uL  Immature Granulocytes 0 Not Estab. %   Immature Grans (Abs) 0.0 0.0 - 0.1 x10E3/uL  Comprehensive metabolic panel with GFR   Collection Time: 01/07/24  4:39 PM  Result Value Ref Range   Glucose 93 70 - 99 mg/dL   BUN 18 6 - 24 mg/dL   Creatinine, Ser 8.86 0.76 - 1.27 mg/dL   eGFR 80 >40 fO/fpw/8.26   BUN/Creatinine Ratio 16 9 - 20   Sodium 141 134 - 144 mmol/L   Potassium 4.4 3.5 - 5.2 mmol/L   Chloride 103 96 - 106 mmol/L   CO2 26 20 - 29 mmol/L   Calcium 9.4 8.7 - 10.2 mg/dL   Total Protein 6.6 6.0 - 8.5 g/dL   Albumin 3.9 (L) 4.1 - 5.1 g/dL   Globulin, Total 2.7 1.5 - 4.5 g/dL   Bilirubin Total 0.3 0.0 - 1.2 mg/dL   Alkaline  Phosphatase 103 47 - 123 IU/L   AST 23 0 - 40 IU/L   ALT 31 0 - 44 IU/L  Lipid panel   Collection Time: 01/07/24  4:39 PM  Result Value Ref Range   Cholesterol, Total 182 100 - 199 mg/dL   Triglycerides 95 0 - 149 mg/dL   HDL 33 (L) >60 mg/dL   VLDL Cholesterol Cal 18 5 - 40 mg/dL   LDL Chol Calc (NIH) 868 (H) 0 - 99 mg/dL   Chol/HDL Ratio 5.5 (H) 0.0 - 5.0 ratio  TSH   Collection Time: 01/07/24  4:39 PM  Result Value Ref Range   TSH 2.420 0.450 - 4.500 uIU/mL  .  Assessment & Plan:   Assessment & Plan COPD exacerbation (HCC) COPD exacerbation with dyspnea. Recent treatment with prednisone  and albuterol inhaler. Rhinovirus detected. Symptoms include dyspnea and chest tightness. Start Breztri  2 puffs twice daily. Start Zithromax  (antibiotic) Continue albuterol 2 puffs 4 times daily as needed wheezing. Continue prednisone  50 mg once daily for 5 days Encouraged to quit smoking  -Orders:   azithromycin  (ZITHROMAX ) 250 MG tablet; 2 DAILY FOR FIRST DAY, THEN DECREASE TO ONE DAILY FOR 4 MORE DAYS.   predniSONE  (DELTASONE ) 50 MG tablet; Take 1 tablet (50 mg total) by mouth daily with breakfast.   budesonide-glycopyrrolate-formoterol (BREZTRI  AEROSPHERE) 160-9-4.8 MCG/ACT AERO inhaler; Inhale 2 puffs into the lungs 2 (two) times daily.   budesonide-glycopyrrolate-formoterol (BREZTRI  AEROSPHERE) 160-9-4.8 MCG/ACT AERO inhaler; Inhale 2 puffs into the lungs 2 (two) times daily.  Mixed hyperlipidemia Recommend continue to work on eating healthy diet and exercise. Consider cholesterol medication.   Orders:   Lipid panel  Primary hypertension Start valsartan /HCTZ 80/12.5 mg once daily. Orders:   EKG 12-Lead   CBC with Differential/Platelet   Comprehensive metabolic panel with GFR   TSH   valsartan -hydrochlorothiazide  (DIOVAN -HCT) 80-12.5 MG tablet; Take 1 tablet by mouth daily.  Encounter for immunization  Orders:   Pneumococcal conjugate vaccine 20-valent  Cigarette  nicotine dependence with other nicotine-induced disorder Recommend cessation. Patient agrees to work on it. He does not want any medication to help.     Other chest pain Your EKG was normal.  I am concerned the chest discomfort you are having is related to your high blood pressure.  Cardiac chest pain is usually exertional.    Severe obesity with body mass index (BMI) of 35.0 to 39.9 with comorbidity (HCC) Comorbity: hypertension Recommend continue to work on eating healthy diet and exercise.       Body mass index is 39.43 kg/m.    Meds ordered this encounter  Medications   azithromycin  (ZITHROMAX ) 250 MG tablet    Sig: 2 DAILY FOR FIRST DAY, THEN DECREASE TO ONE DAILY FOR 4 MORE DAYS.    Dispense:  6 tablet    Refill:  0   predniSONE  (DELTASONE ) 50 MG tablet    Sig: Take 1 tablet (50 mg total) by mouth daily with breakfast.    Dispense:  5 tablet    Refill:  0   budesonide-glycopyrrolate-formoterol (BREZTRI  AEROSPHERE) 160-9-4.8 MCG/ACT AERO inhaler    Sig: Inhale 2 puffs into the lungs 2 (two) times daily.   valsartan -hydrochlorothiazide  (DIOVAN -HCT) 80-12.5 MG tablet    Sig: Take 1 tablet by mouth daily.    Dispense:  90 tablet    Refill:  0   budesonide-glycopyrrolate-formoterol (BREZTRI  AEROSPHERE) 160-9-4.8 MCG/ACT AERO inhaler    Sig: Inhale 2 puffs into the lungs 2 (two) times daily.    Dispense:  10.7 g    Refill:  5    Orders Placed This Encounter  Procedures   Pneumococcal conjugate vaccine 20-valent   CBC with Differential/Platelet   Comprehensive metabolic panel with GFR   Lipid panel   TSH   EKG 12-Lead       Follow-up: Return in about 3 weeks (around 01/28/2024) for chronic follow up.  An After Visit Summary was printed and given to the patient.  Abigail Free, MD [facility] [phone]

## 2024-01-08 ENCOUNTER — Ambulatory Visit: Payer: Self-pay | Admitting: Family Medicine

## 2024-01-08 LAB — CBC WITH DIFFERENTIAL/PLATELET
Basophils Absolute: 0.1 x10E3/uL (ref 0.0–0.2)
Basos: 0 %
EOS (ABSOLUTE): 0.1 x10E3/uL (ref 0.0–0.4)
Eos: 0 %
Hematocrit: 45.7 % (ref 37.5–51.0)
Hemoglobin: 15.4 g/dL (ref 13.0–17.7)
Immature Grans (Abs): 0 x10E3/uL (ref 0.0–0.1)
Immature Granulocytes: 0 %
Lymphocytes Absolute: 3.2 x10E3/uL — ABNORMAL HIGH (ref 0.7–3.1)
Lymphs: 18 %
MCH: 31.4 pg (ref 26.6–33.0)
MCHC: 33.7 g/dL (ref 31.5–35.7)
MCV: 93 fL (ref 79–97)
Monocytes Absolute: 1.4 x10E3/uL — ABNORMAL HIGH (ref 0.1–0.9)
Monocytes: 7 %
Neutrophils Absolute: 13.5 x10E3/uL — ABNORMAL HIGH (ref 1.4–7.0)
Neutrophils: 75 %
Platelets: 325 x10E3/uL (ref 150–450)
RBC: 4.9 x10E6/uL (ref 4.14–5.80)
RDW: 12.3 % (ref 11.6–15.4)
WBC: 18.2 x10E3/uL — ABNORMAL HIGH (ref 3.4–10.8)

## 2024-01-08 LAB — COMPREHENSIVE METABOLIC PANEL WITH GFR
ALT: 31 IU/L (ref 0–44)
AST: 23 IU/L (ref 0–40)
Albumin: 3.9 g/dL — ABNORMAL LOW (ref 4.1–5.1)
Alkaline Phosphatase: 103 IU/L (ref 47–123)
BUN/Creatinine Ratio: 16 (ref 9–20)
BUN: 18 mg/dL (ref 6–24)
Bilirubin Total: 0.3 mg/dL (ref 0.0–1.2)
CO2: 26 mmol/L (ref 20–29)
Calcium: 9.4 mg/dL (ref 8.7–10.2)
Chloride: 103 mmol/L (ref 96–106)
Creatinine, Ser: 1.13 mg/dL (ref 0.76–1.27)
Globulin, Total: 2.7 g/dL (ref 1.5–4.5)
Glucose: 93 mg/dL (ref 70–99)
Potassium: 4.4 mmol/L (ref 3.5–5.2)
Sodium: 141 mmol/L (ref 134–144)
Total Protein: 6.6 g/dL (ref 6.0–8.5)
eGFR: 80 mL/min/1.73 (ref >59)

## 2024-01-08 LAB — LIPID PANEL
Chol/HDL Ratio: 5.5 ratio — ABNORMAL HIGH (ref 0.0–5.0)
Cholesterol, Total: 182 mg/dL (ref 100–199)
HDL: 33 mg/dL — ABNORMAL LOW (ref >39)
LDL Chol Calc (NIH): 131 mg/dL — ABNORMAL HIGH (ref 0–99)
Triglycerides: 95 mg/dL (ref 0–149)
VLDL Cholesterol Cal: 18 mg/dL (ref 5–40)

## 2024-01-08 LAB — TSH: TSH: 2.42 u[IU]/mL (ref 0.450–4.500)

## 2024-01-12 DIAGNOSIS — J441 Chronic obstructive pulmonary disease with (acute) exacerbation: Secondary | ICD-10-CM | POA: Insufficient documentation

## 2024-01-12 DIAGNOSIS — I10 Essential (primary) hypertension: Secondary | ICD-10-CM | POA: Insufficient documentation

## 2024-01-12 DIAGNOSIS — F17200 Nicotine dependence, unspecified, uncomplicated: Secondary | ICD-10-CM | POA: Insufficient documentation

## 2024-01-12 DIAGNOSIS — Z23 Encounter for immunization: Secondary | ICD-10-CM | POA: Insufficient documentation

## 2024-01-12 NOTE — Assessment & Plan Note (Addendum)
 Orders:    Pneumococcal conjugate vaccine 20-valent

## 2024-01-12 NOTE — Assessment & Plan Note (Signed)
 Recommend cessation. Patient agrees to work on it. He does not want any medication to help.

## 2024-01-12 NOTE — Assessment & Plan Note (Signed)
 Recommend continue to work on eating healthy diet and exercise. Consider cholesterol medication.   Orders:   Lipid panel

## 2024-01-12 NOTE — Assessment & Plan Note (Signed)
 Comorbity: hypertension Recommend continue to work on eating healthy diet and exercise.

## 2024-01-12 NOTE — Assessment & Plan Note (Signed)
 COPD exacerbation with dyspnea. Recent treatment with prednisone  and albuterol inhaler. Rhinovirus detected. Symptoms include dyspnea and chest tightness. Start Breztri  2 puffs twice daily. Start Zithromax  (antibiotic) Continue albuterol 2 puffs 4 times daily as needed wheezing. Continue prednisone  50 mg once daily for 5 days Encouraged to quit smoking  -Orders:   azithromycin  (ZITHROMAX ) 250 MG tablet; 2 DAILY FOR FIRST DAY, THEN DECREASE TO ONE DAILY FOR 4 MORE DAYS.   predniSONE  (DELTASONE ) 50 MG tablet; Take 1 tablet (50 mg total) by mouth daily with breakfast.   budesonide-glycopyrrolate-formoterol (BREZTRI  AEROSPHERE) 160-9-4.8 MCG/ACT AERO inhaler; Inhale 2 puffs into the lungs 2 (two) times daily.   budesonide-glycopyrrolate-formoterol (BREZTRI  AEROSPHERE) 160-9-4.8 MCG/ACT AERO inhaler; Inhale 2 puffs into the lungs 2 (two) times daily.

## 2024-01-12 NOTE — Assessment & Plan Note (Signed)
 Your EKG was normal.  I am concerned the chest discomfort you are having is related to your high blood pressure.  Cardiac chest pain is usually exertional.

## 2024-01-12 NOTE — Assessment & Plan Note (Signed)
 Start valsartan /HCTZ 80/12.5 mg once daily. Orders:   EKG 12-Lead   CBC with Differential/Platelet   Comprehensive metabolic panel with GFR   TSH   valsartan -hydrochlorothiazide  (DIOVAN -HCT) 80-12.5 MG tablet; Take 1 tablet by mouth daily.

## 2024-01-15 ENCOUNTER — Inpatient Hospital Stay: Admitting: Family Medicine

## 2024-01-21 ENCOUNTER — Ambulatory Visit (INDEPENDENT_AMBULATORY_CARE_PROVIDER_SITE_OTHER): Admitting: Family Medicine

## 2024-01-21 ENCOUNTER — Encounter: Payer: Self-pay | Admitting: Family Medicine

## 2024-01-21 VITALS — BP 116/82 | HR 96 | Temp 97.8°F | Resp 18 | Ht 79.0 in | Wt 259.8 lb

## 2024-01-21 DIAGNOSIS — I10 Essential (primary) hypertension: Secondary | ICD-10-CM | POA: Diagnosis not present

## 2024-01-21 DIAGNOSIS — J441 Chronic obstructive pulmonary disease with (acute) exacerbation: Secondary | ICD-10-CM

## 2024-01-21 DIAGNOSIS — Z8619 Personal history of other infectious and parasitic diseases: Secondary | ICD-10-CM | POA: Diagnosis not present

## 2024-01-21 MED ORDER — FLUCONAZOLE 150 MG PO TABS: 150.0000 mg | ORAL_TABLET | Freq: Every day | ORAL | 0 refills | Status: AC

## 2024-01-21 NOTE — Assessment & Plan Note (Addendum)
 COPD with acute exacerbation COPD exacerbation with improved breathing but persistent symptoms. Recent inhaled steroid use led to oral candidiasis. - Continue Breztri , two puffs twice daily, rinse mouth after use. - Use albuterol as needed for rescue. - Encouraged smoking cessation. - Provided samples of Mucinex to help thin mucus. - Encouraged increased fluid intake.

## 2024-01-21 NOTE — Progress Notes (Signed)
 "  Subjective:  Patient ID: Brent Williamson, male    DOB: [birth year]  Age: 48 y.o. MRN: [id number]  Chief Complaint  Patient presents with   Medical Management of Chronic Issues    Discussed the use of AI scribe software for clinical note transcription with the patient, who gave verbal consent to proceed.  History of Present Illness   Brent Williamson is a 48 year old male who presents with breathing difficulties.  Dyspnea and cough - Breathing difficulties have improved but are not completely resolved. - Cough present with difficulty expectorating thick mucus. - No recent use of Mucinex; plans to resume for mucus clearance. - Sensation of throat being 'locked, slammed up'. - Initially attributed symptoms to a cold. - Reduction in smoking, particularly during periods of worsened breathing.  Medication use and adverse effects - Prescribed albuterol and Breztri  for respiratory symptoms. - Did not rinse mouth after Breztri  use, resulting in oral thrush. - Oral thrush treated with Diflucan . - Received a course of prednisone  and azithromycin  (Z-Pak) prior to thrush development.  Infectious disease evaluation - Tested for strep, COVID-19, and influenza; all results negative.  Hypertension - Previously elevated blood pressure up to 178/96 mmHg, asymptomatic. - Discontinued antihypertensive medication. - No headaches, dizziness, or blurred vision.         05/12/2021   11:08 AM  Depression screen PHQ 2/9  Decreased Interest 0  Down, Depressed, Hopeless 0  PHQ - 2 Score 0        05/12/2021   11:08 AM  Fall Risk   Falls in the past year? 0  Number falls in past yr: 0  Injury with Fall? 0      Data saved with a previous flowsheet row definition    Patient Care Team: Cox, Abigail, MD as PCP - General (Family Medicine)   Review of Systems  Constitutional:  Negative for appetite change, fatigue and fever.  HENT:  Positive for sore throat (thrush - treated). Negative  for congestion, ear pain and sinus pressure.   Eyes: Negative.   Respiratory:  Positive for shortness of breath. Negative for cough, chest tightness and wheezing.   Cardiovascular:  Negative for chest pain and palpitations.  Gastrointestinal:  Negative for abdominal pain, constipation, diarrhea, nausea and vomiting.  Endocrine: Negative.   Genitourinary:  Negative for dysuria, frequency, hematuria and urgency.  Musculoskeletal:  Negative for arthralgias, back pain, joint swelling and myalgias.  Skin:  Negative for rash.  Allergic/Immunologic: Negative.   Neurological:  Negative for dizziness, weakness, light-headedness and headaches.  Hematological: Negative.   Psychiatric/Behavioral:  Negative for dysphoric mood. The patient is not nervous/anxious.     Medications Ordered Prior to Encounter[1] Past Medical History:  Diagnosis Date   Chest pain    Dizziness    Dyspnea    Elevated blood pressure reading without diagnosis of hypertension    Hesitancy of micturition    Hyperlipidemia    Palpitations    Pedal edema    Precordial pain    Skin change    Urinary hesitancy    Past Surgical History:  Procedure Laterality Date   KIDNEY STONES      Family History  Problem Relation Age of Onset   Heart attack Mother    Diabetes Mother    Hypertension Mother    Heart attack Father    Diabetes Father    Healthy Sister    Healthy Sister    Social History   Socioeconomic History  Marital status: Divorced    Spouse name: Not on file   Number of children: Not on file   Years of education: Not on file   Highest education level: Some college, no degree  Occupational History   Not on file  Tobacco Use   Smoking status: Every Day    Current packs/day: 1.00    Types: Cigarettes   Smokeless tobacco: Current    Types: Chew  Substance and Sexual Activity   Alcohol use: Yes    Comment: socially   Drug use: Not Currently   Sexual activity: Not on file  Other Topics Concern    Not on file  Social History Narrative   Not on file   Social Drivers of Health   Tobacco Use: High Risk (01/21/2024)   Patient History    Smoking Tobacco Use: Every Day    Smokeless Tobacco Use: Current    Passive Exposure: Not on file  Financial Resource Strain: Low Risk (01/20/2024)   Overall Financial Resource Strain (CARDIA)    Difficulty of Paying Living Expenses: Not hard at all  Food Insecurity: No Food Insecurity (01/20/2024)   Epic    Worried About Radiation Protection Practitioner of Food in the Last Year: Never true    Ran Out of Food in the Last Year: Never true  Transportation Needs: No Transportation Needs (01/20/2024)   Epic    Lack of Transportation (Medical): No    Lack of Transportation (Non-Medical): No  Physical Activity: Inactive (01/20/2024)   Exercise Vital Sign    Days of Exercise per Week: 0 days    Minutes of Exercise per Session: Not on file  Stress: No Stress Concern Present (01/20/2024)   [employer] of Occupational Health - Occupational Stress Questionnaire    Feeling of Stress: Not at all  Social Connections: Moderately Isolated (01/20/2024)   Social Connection and Isolation Panel    Frequency of Communication with Friends and Family: More than three times a week    Frequency of Social Gatherings with Friends and Family: More than three times a week    Attends Religious Services: Never    Database Administrator or Organizations: No    Attends Engineer, Structural: Not on file    Marital Status: Living with partner  Depression (PHQ2-9): Low Risk (05/12/2021)   Depression (PHQ2-9)    PHQ-2 Score: 0  Alcohol Screen: Low Risk (01/20/2024)   Alcohol Screen    Last Alcohol Screening Score (AUDIT): 5  Recent Concern: Alcohol Screen - Medium Risk (01/07/2024)   Alcohol Screen    Last Alcohol Screening Score (AUDIT): 8  Housing: Unknown (01/20/2024)   Epic    Unable to Pay for Housing in the Last Year: No    Number of Times Moved in the Last Year: Not on  file    Homeless in the Last Year: No  Utilities: Not At Risk (01/07/2024)   Epic    Threatened with loss of utilities: No  Health Literacy: Adequate Health Literacy (01/07/2024)   B1300 Health Literacy    Frequency of need for help with medical instructions: Never    Objective:  BP 116/82   Pulse 96   Temp 97.8 F (36.6 C) (Temporal)   Resp 18   Ht 6' 7 (2.007 m)   Wt 259 lb 12.8 oz (117.8 kg)   SpO2 97%   BMI 29.27 kg/m      01/21/2024    1:40 PM 01/07/2024    4:22 PM 01/07/2024  3:37 PM  BP/Weight  Systolic BP 116 160 178  Diastolic BP 82 88 96  Wt. (Lbs) 259.8  350  BMI 29.27 kg/m2  39.43 kg/m2    Physical Exam Vitals reviewed.  Constitutional:      General: He is not in acute distress.    Appearance: Normal appearance. He is obese. He is not ill-appearing.  Cardiovascular:     Rate and Rhythm: Normal rate and regular rhythm.     Heart sounds: Normal heart sounds. No murmur heard. Pulmonary:     Effort: Pulmonary effort is normal. Tachypnea present.     Breath sounds: Decreased air movement present. Examination of the right-lower field reveals decreased breath sounds. Examination of the left-lower field reveals decreased breath sounds. Decreased breath sounds present. No wheezing, rhonchi or rales.  Abdominal:     General: Bowel sounds are normal.     Palpations: Abdomen is soft.     Tenderness: There is no abdominal tenderness.  Neurological:     Mental Status: He is alert. Mental status is at baseline.  Psychiatric:        Mood and Affect: Mood normal.        Behavior: Behavior normal.     Lab Results  Component Value Date   WBC 18.2 (H) 01/07/2024   HGB 15.4 01/07/2024   HCT 45.7 01/07/2024   PLT 325 01/07/2024   GLUCOSE 93 01/07/2024   CHOL 182 01/07/2024   TRIG 95 01/07/2024   HDL 33 (L) 01/07/2024   LDLCALC 131 (H) 01/07/2024   ALT 31 01/07/2024   AST 23 01/07/2024   NA 141 01/07/2024   K 4.4 01/07/2024   CL 103 01/07/2024   CREATININE  1.13 01/07/2024   BUN 18 01/07/2024   CO2 26 01/07/2024   TSH 2.420 01/07/2024   HGBA1C 5.7 (H) 09/29/2021    Results for orders placed or performed in visit on 01/07/24  CBC with Differential/Platelet   Collection Time: 01/07/24  4:39 PM  Result Value Ref Range   WBC 18.2 (H) 3.4 - 10.8 x10E3/uL   RBC 4.90 4.14 - 5.80 x10E6/uL   Hemoglobin 15.4 13.0 - 17.7 g/dL   Hematocrit 54.2 62.4 - 51.0 %   MCV 93 79 - 97 fL   MCH 31.4 26.6 - 33.0 pg   MCHC 33.7 31.5 - 35.7 g/dL   RDW 87.6 88.3 - 84.5 %   Platelets 325 150 - 450 x10E3/uL   Neutrophils 75 Not Estab. %   Lymphs 18 Not Estab. %   Monocytes 7 Not Estab. %   Eos 0 Not Estab. %   Basos 0 Not Estab. %   Neutrophils Absolute 13.5 (H) 1.4 - 7.0 x10E3/uL   Lymphocytes Absolute 3.2 (H) 0.7 - 3.1 x10E3/uL   Monocytes Absolute 1.4 (H) 0.1 - 0.9 x10E3/uL   EOS (ABSOLUTE) 0.1 0.0 - 0.4 x10E3/uL   Basophils Absolute 0.1 0.0 - 0.2 x10E3/uL   Immature Granulocytes 0 Not Estab. %   Immature Grans (Abs) 0.0 0.0 - 0.1 x10E3/uL  Comprehensive metabolic panel with GFR   Collection Time: 01/07/24  4:39 PM  Result Value Ref Range   Glucose 93 70 - 99 mg/dL   BUN 18 6 - 24 mg/dL   Creatinine, Ser 8.86 0.76 - 1.27 mg/dL   eGFR 80 >40 fO/fpw/8.26   BUN/Creatinine Ratio 16 9 - 20   Sodium 141 134 - 144 mmol/L   Potassium 4.4 3.5 - 5.2 mmol/L   Chloride 103 96 -  106 mmol/L   CO2 26 20 - 29 mmol/L   Calcium 9.4 8.7 - 10.2 mg/dL   Total Protein 6.6 6.0 - 8.5 g/dL   Albumin 3.9 (L) 4.1 - 5.1 g/dL   Globulin, Total 2.7 1.5 - 4.5 g/dL   Bilirubin Total 0.3 0.0 - 1.2 mg/dL   Alkaline Phosphatase 103 47 - 123 IU/L   AST 23 0 - 40 IU/L   ALT 31 0 - 44 IU/L  Lipid panel   Collection Time: 01/07/24  4:39 PM  Result Value Ref Range   Cholesterol, Total 182 100 - 199 mg/dL   Triglycerides 95 0 - 149 mg/dL   HDL 33 (L) >60 mg/dL   VLDL Cholesterol Cal 18 5 - 40 mg/dL   LDL Chol Calc (NIH) 868 (H) 0 - 99 mg/dL   Chol/HDL Ratio 5.5 (H) 0.0 - 5.0  ratio  TSH   Collection Time: 01/07/24  4:39 PM  Result Value Ref Range   TSH 2.420 0.450 - 4.500 uIU/mL  .  Assessment & Plan:   Assessment & Plan COPD exacerbation (HCC) COPD with acute exacerbation COPD exacerbation with improved breathing but persistent symptoms. Recent inhaled steroid use led to oral candidiasis. - Continue Breztri , two puffs twice daily, rinse mouth after use. - Use albuterol as needed for rescue. - Encouraged smoking cessation. - Provided samples of Mucinex to help thin mucus. - Encouraged increased fluid intake.    History of thrush Oral candidiasis Resolved with Diflucan  treatment. - Prescribed Diflucan  for future occurrences. - Educated on rinsing mouth after inhaled steroids. Orders:   fluconazole  (DIFLUCAN ) 150 MG tablet; Take 1 tablet (150 mg total) by mouth daily for 2 doses.  Primary hypertension Primary hypertension Previously elevated blood pressure due to non-compliance, now improved. BP Readings from Last 3 Encounters:  01/21/24 116/82  01/07/24 (!) 160/88  09/29/21 128/84  - Continue current antihypertensive medication regimen.      Follow-up: Return in about 3 months (around 04/20/2024) for chronic, lab visit.  An After Visit Summary was printed and given to the patient.  Harrie Cedar, FNP [facility] [phone]      [1]  Current Outpatient Medications on File Prior to Visit  Medication Sig Dispense Refill   albuterol (VENTOLIN HFA) 108 (90 Base) MCG/ACT inhaler Inhale 2 puffs into the lungs every 6 (six) hours as needed.     valsartan -hydrochlorothiazide  (DIOVAN -HCT) 80-12.5 MG tablet Take 1 tablet by mouth daily. 90 tablet 0   No current facility-administered medications on file prior to visit.   "

## 2024-01-21 NOTE — Assessment & Plan Note (Addendum)
 Primary hypertension Previously elevated blood pressure due to non-compliance, now improved. BP Readings from Last 3 Encounters:  01/21/24 116/82  01/07/24 (!) 160/88  09/29/21 128/84  - Continue current antihypertensive medication regimen.

## 2024-01-21 NOTE — Assessment & Plan Note (Addendum)
 Oral candidiasis Resolved with Diflucan  treatment. - Prescribed Diflucan  for future occurrences. - Educated on rinsing mouth after inhaled steroids. Orders:   fluconazole  (DIFLUCAN ) 150 MG tablet; Take 1 tablet (150 mg total) by mouth daily for 2 doses.

## 2024-04-24 ENCOUNTER — Ambulatory Visit: Admitting: Family Medicine
